# Patient Record
Sex: Female | Born: 1955
Health system: Southern US, Community
[De-identification: ages and names within clinical notes are randomized; demographics above are authoritative.]

## PROBLEM LIST (undated history)

## (undated) DIAGNOSIS — I809 Phlebitis and thrombophlebitis of unspecified site: Secondary | ICD-10-CM

## (undated) HISTORY — DX: Phlebitis and thrombophlebitis of unspecified site: I80.9

## (undated) HISTORY — PX: MOHS SURGERY: SUR867

---

## 1998-06-17 ENCOUNTER — Other Ambulatory Visit: Admission: RE | Admit: 1998-06-17 | Discharge: 1998-06-17 | Payer: Self-pay | Admitting: Obstetrics & Gynecology

## 1999-08-04 ENCOUNTER — Other Ambulatory Visit: Admission: RE | Admit: 1999-08-04 | Discharge: 1999-08-04 | Payer: Self-pay | Admitting: Obstetrics & Gynecology

## 2000-04-09 ENCOUNTER — Ambulatory Visit (HOSPITAL_BASED_OUTPATIENT_CLINIC_OR_DEPARTMENT_OTHER): Admission: RE | Admit: 2000-04-09 | Discharge: 2000-04-09 | Payer: Self-pay | Admitting: Plastic Surgery

## 2000-04-09 ENCOUNTER — Encounter (INDEPENDENT_AMBULATORY_CARE_PROVIDER_SITE_OTHER): Payer: Self-pay | Admitting: Specialist

## 2000-08-09 ENCOUNTER — Other Ambulatory Visit: Admission: RE | Admit: 2000-08-09 | Discharge: 2000-08-09 | Payer: Self-pay | Admitting: Obstetrics & Gynecology

## 2000-09-17 ENCOUNTER — Other Ambulatory Visit: Admission: RE | Admit: 2000-09-17 | Discharge: 2000-09-17 | Payer: Self-pay | Admitting: Obstetrics & Gynecology

## 2000-09-17 ENCOUNTER — Encounter (INDEPENDENT_AMBULATORY_CARE_PROVIDER_SITE_OTHER): Payer: Self-pay

## 2000-12-15 ENCOUNTER — Other Ambulatory Visit: Admission: RE | Admit: 2000-12-15 | Discharge: 2000-12-15 | Payer: Self-pay | Admitting: Obstetrics & Gynecology

## 2001-06-17 ENCOUNTER — Other Ambulatory Visit: Admission: RE | Admit: 2001-06-17 | Discharge: 2001-06-17 | Payer: Self-pay | Admitting: Obstetrics & Gynecology

## 2001-12-19 ENCOUNTER — Other Ambulatory Visit: Admission: RE | Admit: 2001-12-19 | Discharge: 2001-12-19 | Payer: Self-pay | Admitting: Obstetrics & Gynecology

## 2002-07-19 ENCOUNTER — Other Ambulatory Visit: Admission: RE | Admit: 2002-07-19 | Discharge: 2002-07-19 | Payer: Self-pay | Admitting: Obstetrics & Gynecology

## 2003-01-18 ENCOUNTER — Other Ambulatory Visit: Admission: RE | Admit: 2003-01-18 | Discharge: 2003-01-18 | Payer: Self-pay | Admitting: Obstetrics & Gynecology

## 2004-02-27 ENCOUNTER — Other Ambulatory Visit: Admission: RE | Admit: 2004-02-27 | Discharge: 2004-02-27 | Payer: Self-pay | Admitting: Obstetrics & Gynecology

## 2004-03-20 ENCOUNTER — Ambulatory Visit: Admission: RE | Admit: 2004-03-20 | Discharge: 2004-03-20 | Payer: Self-pay | Admitting: Hematology & Oncology

## 2004-09-05 ENCOUNTER — Ambulatory Visit: Payer: Self-pay | Admitting: Family Medicine

## 2005-03-19 ENCOUNTER — Other Ambulatory Visit: Admission: RE | Admit: 2005-03-19 | Discharge: 2005-03-19 | Payer: Self-pay | Admitting: Obstetrics & Gynecology

## 2007-01-18 DIAGNOSIS — I809 Phlebitis and thrombophlebitis of unspecified site: Secondary | ICD-10-CM | POA: Insufficient documentation

## 2007-09-21 LAB — CONVERTED CEMR LAB: Pap Smear: NORMAL

## 2007-11-25 ENCOUNTER — Ambulatory Visit: Payer: Self-pay | Admitting: Family Medicine

## 2007-11-28 ENCOUNTER — Encounter (INDEPENDENT_AMBULATORY_CARE_PROVIDER_SITE_OTHER): Payer: Self-pay | Admitting: *Deleted

## 2007-12-01 LAB — CONVERTED CEMR LAB
ALT: 20 units/L (ref 0–35)
AST: 22 units/L (ref 0–37)
Albumin: 4.1 g/dL (ref 3.5–5.2)
Alkaline Phosphatase: 55 units/L (ref 39–117)
BUN: 13 mg/dL (ref 6–23)
Basophils Absolute: 0 10*3/uL (ref 0.0–0.1)
Basophils Relative: 0.5 % (ref 0.0–1.0)
Bilirubin, Direct: 0.1 mg/dL (ref 0.0–0.3)
CO2: 29 meq/L (ref 19–32)
Calcium: 9.1 mg/dL (ref 8.4–10.5)
Chloride: 104 meq/L (ref 96–112)
Cholesterol: 193 mg/dL (ref 0–200)
Creatinine, Ser: 0.9 mg/dL (ref 0.4–1.2)
Eosinophils Absolute: 0.2 10*3/uL (ref 0.0–0.7)
Eosinophils Relative: 3.9 % (ref 0.0–5.0)
GFR calc Af Amer: 85 mL/min
GFR calc non Af Amer: 70 mL/min
Glucose, Bld: 93 mg/dL (ref 70–99)
HCT: 37.4 % (ref 36.0–46.0)
HDL: 82.5 mg/dL (ref >39.0)
Hemoglobin: 12.9 g/dL (ref 12.0–15.0)
LDL Cholesterol: 103 mg/dL — ABNORMAL HIGH (ref 0–99)
Lymphocytes Relative: 26 % (ref 12.0–46.0)
MCHC: 34.5 g/dL (ref 30.0–36.0)
MCV: 98 fL (ref 78.0–100.0)
Monocytes Absolute: 0.4 10*3/uL (ref 0.1–1.0)
Monocytes Relative: 7.6 % (ref 3.0–12.0)
Neutro Abs: 3.6 10*3/uL (ref 1.4–7.7)
Neutrophils Relative %: 62 % (ref 43.0–77.0)
Platelets: 309 10*3/uL (ref 150–400)
Potassium: 3.7 meq/L (ref 3.5–5.1)
RBC: 3.81 M/uL — ABNORMAL LOW (ref 3.87–5.11)
RDW: 12.3 % (ref 11.5–14.6)
Sodium: 139 meq/L (ref 135–145)
TSH: 2.89 microintl units/mL (ref 0.35–5.50)
Total Bilirubin: 0.8 mg/dL (ref 0.3–1.2)
Total CHOL/HDL Ratio: 2.3
Total Protein: 6.5 g/dL (ref 6.0–8.3)
Triglycerides: 40 mg/dL (ref 0–149)
VLDL: 8 mg/dL (ref 0–40)
WBC: 5.7 10*3/uL (ref 4.5–10.5)

## 2007-12-02 ENCOUNTER — Encounter (INDEPENDENT_AMBULATORY_CARE_PROVIDER_SITE_OTHER): Payer: Self-pay | Admitting: *Deleted

## 2008-02-08 ENCOUNTER — Telehealth: Payer: Self-pay | Admitting: Family Medicine

## 2008-02-22 ENCOUNTER — Ambulatory Visit: Payer: Self-pay | Admitting: Internal Medicine

## 2008-03-07 ENCOUNTER — Ambulatory Visit: Payer: Self-pay | Admitting: Internal Medicine

## 2008-03-07 HISTORY — PX: COLONOSCOPY: SHX174

## 2008-03-07 LAB — HM COLONOSCOPY

## 2008-09-19 LAB — CONVERTED CEMR LAB: Pap Smear: NORMAL

## 2009-09-03 ENCOUNTER — Ambulatory Visit: Payer: Self-pay | Admitting: Family Medicine

## 2009-09-03 DIAGNOSIS — M7500 Adhesive capsulitis of unspecified shoulder: Secondary | ICD-10-CM | POA: Insufficient documentation

## 2009-09-03 LAB — CONVERTED CEMR LAB
Bilirubin Urine: NEGATIVE
Blood in Urine, dipstick: NEGATIVE
Glucose, Urine, Semiquant: NEGATIVE
Ketones, urine, test strip: NEGATIVE
Nitrite: NEGATIVE
Specific Gravity, Urine: 1.025
Urobilinogen, UA: NEGATIVE
WBC Urine, dipstick: NEGATIVE
pH: 5

## 2009-09-04 LAB — CONVERTED CEMR LAB
BUN: 14 mg/dL (ref 6–23)
Basophils Absolute: 0 10*3/uL (ref 0.0–0.1)
Basophils Relative: 0.1 % (ref 0.0–3.0)
CO2: 30 meq/L (ref 19–32)
Calcium: 9.1 mg/dL (ref 8.4–10.5)
Chloride: 106 meq/L (ref 96–112)
Cholesterol: 196 mg/dL (ref 0–200)
Creatinine, Ser: 0.9 mg/dL (ref 0.4–1.2)
Eosinophils Absolute: 0.2 10*3/uL (ref 0.0–0.7)
Eosinophils Relative: 3.1 % (ref 0.0–5.0)
GFR calc non Af Amer: 69.27 mL/min (ref >60)
Glucose, Bld: 77 mg/dL (ref 70–99)
HCT: 35.9 % — ABNORMAL LOW (ref 36.0–46.0)
HDL: 86 mg/dL (ref >39.00)
Hemoglobin: 11.9 g/dL — ABNORMAL LOW (ref 12.0–15.0)
LDL Cholesterol: 99 mg/dL (ref 0–99)
Lymphocytes Relative: 31.3 % (ref 12.0–46.0)
Lymphs Abs: 1.5 10*3/uL (ref 0.7–4.0)
MCHC: 33.3 g/dL (ref 30.0–36.0)
MCV: 97.9 fL (ref 78.0–100.0)
Monocytes Absolute: 0.3 10*3/uL (ref 0.1–1.0)
Monocytes Relative: 6.6 % (ref 3.0–12.0)
Neutro Abs: 2.9 10*3/uL (ref 1.4–7.7)
Neutrophils Relative %: 58.9 % (ref 43.0–77.0)
Platelets: 274 10*3/uL (ref 150.0–400.0)
Potassium: 4.4 meq/L (ref 3.5–5.1)
RBC: 3.67 M/uL — ABNORMAL LOW (ref 3.87–5.11)
RDW: 12.6 % (ref 11.5–14.6)
Sodium: 142 meq/L (ref 135–145)
TSH: 2.71 microintl units/mL (ref 0.35–5.50)
Total CHOL/HDL Ratio: 2
Triglycerides: 53 mg/dL (ref 0.0–149.0)
VLDL: 10.6 mg/dL (ref 0.0–40.0)
WBC: 4.9 10*3/uL (ref 4.5–10.5)

## 2010-07-08 NOTE — Assessment & Plan Note (Signed)
Summary: CPX,NO PAP,BCBS INS/RH......   Vital Signs:  Patient profile:   55 year old female Height:      66 inches Weight:      151 pounds BMI:     24.46 Pulse rate:   72 / minute Pulse rhythm:   regular BP sitting:   120 / 78  (left arm) Cuff size:   regular  Vitals Entered By: Army Fossa CMA (September 03, 2009 9:12 AM) CC: CPX, no pap. Pt is fasting   History of Present Illness: Pt here for cpe and labs.  No pap-- pt sees Dr Ishmael Holter.  Preventive Screening-Counseling & Management  Alcohol-Tobacco     Alcohol drinks/day: 1     Alcohol type: wine     Smoking Status: never     Passive Smoke Exposure: no  Caffeine-Diet-Exercise     Caffeine use/day: 3-4     Does Patient Exercise: yes     Type of exercise: water aerobics, treadmill, elliptical     Exercise (avg: min/session): 30-60     Times/week: 5     Exercise Counseling: not indicated; exercise is adequate  Hep-HIV-STD-Contraception     HIV Risk: no     Dental Visit-last 6 months yes     Dental Care Counseling: not indicated; dental care within six months     Sun Exposure-Excessive: rarely  Safety-Violence-Falls     Seat Belt Use: 100      Sexual History:  currently monogamous.    Current Medications (verified): 1)  Aspirin 81 Mg  Tbec (Aspirin) .... Take One Tablet Daily 2)  Womens Multivitamin Plus   Tabs (Multiple Vitamins-Minerals) .Marland Kitchen.. 1 By Mouth Once Daily 3)  Calcium 500/d 500-200 Mg-Unit  Tabs (Calcium Carbonate-Vitamin D) .... 3aday 4)  D 1000 Plus   Tabs (Fa-Cyanocobalamin-B6-D-Ca) .Marland Kitchen.. 1 By Mouth Once Daily 5)  B-100 Complex   Tabs (Vitamins-Lipotropics) .Marland Kitchen.. 1 By Mouth Qd  Allergies (verified): No Known Drug Allergies  Past History:  Past Medical History: Last updated: 11/25/2007 phlebitis after c section--l leg BCC--  Amy [place]  Past Surgical History: Last updated: 11/25/2007 Caesarean section X2  Family History: Last updated: 11/25/2007 Family History Breast cancer 1st degree  relative <50 Family History Diabetes 1st degree relative M-  Alz dementia Family History of CAD Female 1st degree relative <60  Social History: Last updated: 11/25/2007 Married with 2 sons  Never Smoked Alcohol use-yes Drug use-no Regular exercise-yes  Risk Factors: Alcohol Use: 1 (09/03/2009) Caffeine Use: 3-4 (09/03/2009) Exercise: yes (09/03/2009)  Risk Factors: Smoking Status: never (09/03/2009) Passive Smoke Exposure: no (09/03/2009)  Family History: Reviewed history from 11/25/2007 and no changes required. Family History Breast cancer 1st degree relative <50 Family History Diabetes 1st degree relative M-  Alz dementia Family History of CAD Female 1st degree relative <60  Social History: Reviewed history from 11/25/2007 and no changes required. Married with 2 sons  Never Smoked Alcohol use-yes Drug use-no Regular exercise-yes Caffeine use/day:  3-4 Dental Care w/in 6 mos.:  yes Sexual History:  currently monogamous  Review of Systems      See HPI General:  Denies chills, fatigue, fever, loss of appetite, malaise, sleep disorder, sweats, weakness, and weight loss. Eyes:  Denies blurring, discharge, double vision, eye irritation, eye pain, halos, itching, light sensitivity, red eye, vision loss-1 eye, and vision loss-both eyes; optho--q2y. ENT:  Denies decreased hearing, difficulty swallowing, ear discharge, earache, hoarseness, nasal congestion, nosebleeds, postnasal drainage, ringing in ears, sinus pressure, and sore throat. CV:  Denies bluish discoloration of lips or nails, chest pain or discomfort, difficulty breathing at night, difficulty breathing while lying down, fainting, fatigue, leg cramps with exertion, lightheadness, near fainting, palpitations, shortness of breath with exertion, swelling of feet, swelling of hands, and weight gain. Resp:  Denies chest discomfort, chest pain with inspiration, cough, coughing up blood, excessive snoring, hypersomnolence,  morning headaches, pleuritic, shortness of breath, sputum productive, and wheezing. GI:  Denies abdominal pain, bloody stools, change in bowel habits, constipation, dark tarry stools, diarrhea, excessive appetite, gas, hemorrhoids, indigestion, loss of appetite, nausea, vomiting, vomiting blood, and yellowish skin color. GU:  Denies abnormal vaginal bleeding, decreased libido, discharge, dysuria, genital sores, hematuria, incontinence, nocturia, urinary frequency, and urinary hesitancy. MS:  Denies joint pain, joint redness, joint swelling, loss of strength, low back pain, mid back pain, muscle aches, muscle , cramps, muscle weakness, stiffness, and thoracic pain. Derm:  Denies changes in color of skin, changes in nail beds, dryness, excessive perspiration, flushing, hair loss, insect bite(s), itching, lesion(s), poor wound healing, and rash; optho-q2y. Neuro:  Denies brief paralysis, difficulty with concentration, disturbances in coordination, falling down, headaches, inability to speak, memory loss, numbness, poor balance, seizures, sensation of room spinning, tingling, tremors, visual disturbances, and weakness. Psych:  Denies alternate hallucination ( auditory/visual), anxiety, depression, easily angered, easily tearful, irritability, mental problems, panic attacks, sense of great danger, suicidal thoughts/plans, thoughts of violence, unusual visions or sounds, and thoughts /plans of harming others. Endo:  Denies cold intolerance, excessive hunger, excessive thirst, excessive urination, heat intolerance, polyuria, and weight change. Heme:  Denies abnormal bruising, bleeding, enlarge lymph nodes, fevers, pallor, and skin discoloration.  Physical Exam  General:  Well-developed,well-nourished,in no acute distress; alert,appropriate and cooperative throughout examination Head:  Normocephalic and atraumatic without obvious abnormalities. No apparent alopecia or balding. Eyes:  No corneal or conjunctival  inflammation noted. EOMI. Perrla. Funduscopic exam benign, without hemorrhages, exudates or papilledema. Vision grossly normal. Ears:  External ear exam shows no significant lesions or deformities.  Otoscopic examination reveals clear canals, tympanic membranes are intact bilaterally without bulging, retraction, inflammation or discharge. Hearing is grossly normal bilaterally. Nose:  External nasal examination shows no deformity or inflammation. Nasal mucosa are pink and moist without lesions or exudates. Mouth:  Oral mucosa and oropharynx without lesions or exudates.  Teeth in good repair. Neck:  No deformities, masses, or tenderness noted.no carotid bruits.   Chest Wall:  No deformities, masses, or tenderness noted. Breasts:  No mass, nodules, thickening, tenderness, bulging, retraction, inflamation, nipple discharge or skin changes noted.   Lungs:  Normal respiratory effort, chest expands symmetrically. Lungs are clear to auscultation, no crackles or wheezes. Heart:  normal rate and no murmur.   Abdomen:  Bowel sounds positive,abdomen soft and non-tender without masses, organomegaly or hernias noted. Rectal:  No external abnormalities noted. Normal sphincter tone. No rectal masses or tenderness. Msk:  normal ROM, no joint tenderness, no joint swelling, no joint warmth, no redness over joints, no joint deformities, no joint instability, and no crepitation.   Pulses:  R posterior tibial normal, R dorsalis pedis normal, R carotid normal, L posterior tibial normal, L dorsalis pedis normal, and L carotid normal.   Extremities:  No clubbing, cyanosis, edema, or deformity noted with normal full range of motion of all joints.   Neurologic:  No cranial nerve deficits noted. Station and gait are normal. Plantar reflexes are down-going bilaterally. DTRs are symmetrical throughout. Sensory, motor and coordinative functions appear intact. Skin:  Intact without suspicious  lesions or rashes Cervical Nodes:  No  lymphadenopathy noted Psych:  Cognition and judgment appear intact. Alert and cooperative with normal attention span and concentration. No apparent delusions, illusions, hallucinations   Impression & Recommendations:  Problem # 1:  PREVENTIVE HEALTH CARE (ICD-V70.0) check labs ghm utd Orders: Venipuncture (16109) TLB-Lipid Panel (80061-LIPID) TLB-BMP (Basic Metabolic Panel-BMET) (80048-METABOL) TLB-CBC Platelet - w/Differential (85025-CBCD) TLB-Hepatic/Liver Function Pnl (80076-HEPATIC) TLB-TSH (Thyroid Stimulating Hormone) (84443-TSH) UA Dipstick w/o Micro (manual) (60454) EKG w/ Interpretation (93000)  Complete Medication List: 1)  Aspirin 81 Mg Tbec (Aspirin) .... Take one tablet daily 2)  Womens Multivitamin Plus Tabs (Multiple vitamins-minerals) .Marland Kitchen.. 1 by mouth once daily 3)  Calcium 500/d 500-200 Mg-unit Tabs (Calcium carbonate-vitamin d) .... 3aday 4)  D 1000 Plus Tabs (Fa-cyanocobalamin-b6-d-ca) .Marland Kitchen.. 1 by mouth once daily 5)  B-100 Complex Tabs (Vitamins-lipotropics) .Marland Kitchen.. 1 by mouth qd   EKG  Procedure date:  09/03/2009  Findings:      Normal sinus rhythm with rate of:  65   Last PAP:  Normal (09/21/2007 9:41:37 AM) PAP Result Date:  09/19/2008 PAP Result:  normal PAP Next Due:  1 yr Last Mammogram:  Normal Bilateral (09/21/2007 9:41:07 AM) Mammogram Result Date:  09/19/2008 Mammogram Result:  normal Mammogram Next Due:  1 yr  Laboratory Results   Urine Tests    Routine Urinalysis   Color: yellow Appearance: Clear Glucose: negative   (Normal Range: Negative) Bilirubin: negative   (Normal Range: Negative) Ketone: negative   (Normal Range: Negative) Spec. Gravity: 1.025   (Normal Range: 1.003-1.035) Blood: negative   (Normal Range: Negative) pH: 5.0   (Normal Range: 5.0-8.0) Protein: trace   (Normal Range: Negative) Urobilinogen: negative   (Normal Range: 0-1) Nitrite: negative   (Normal Range: Negative) Leukocyte Esterace: negative   (Normal Range:  Negative)    Comments: Floydene Flock  September 03, 2009 1:01 PM

## 2010-07-08 NOTE — Letter (Signed)
Summary: Results Follow-up Letter  [place] [facility]  [address] [place], [place] [address]   Phone: [phone]  Fax: [phone]    12/02/2007        Anita Galloway [address] [place], [place]  [address]  Dear Ms. Laroche,   The following are the results of your recent test(s):  Test     Result     Pap Smear    Normal_______  Not Normal_____       Comments: _________________________________________________________ Cholesterol LDL(Bad cholesterol):          Your goal is less than:         HDL (Good cholesterol):        Your goal is more than: _________________________________________________________ Other Tests:   _________________________________________________________  Please call for an appointment Or __Please see attached lab report.______________________________________________________ _________________________________________________________ _________________________________________________________  Sincerely,  Ardyth Man [place] at [facility]

## 2010-07-08 NOTE — Letter (Signed)
Summary: Primary Care Consult Scheduled Letter  [place] [facility] [place]  [address] [place], [place] [address]   Phone: [phone]  Fax: [phone]      11/28/2007 MRN: [id number]  Anita Galloway [address] [place], [place]  [address]    Dear Ms. Orlowski,      We have scheduled an appointment for you.  At the recommendation of Dr.Lowne, we have scheduled you a consult with Dr. Juanda Chance on July 16th at 11:00 check in at 10:00am.  Their address is [facility] [address] The office phone number is [phone].  If this appointment day and time is not convenient for you, please feel free to call the office of the doctor you are being referred to at the number listed above and reschedule the appointment.     It is important for you to keep your scheduled appointments. We are here to make sure you are given good patient care. If you have questions or you have made changes to your appointment, please notify us at  [phone], ask for Tiffany.    Thank you,  Patient Care Coordinator [place] at [facility]

## 2010-07-08 NOTE — Procedures (Signed)
Summary: Colonoscopy   Colonoscopy  Procedure date:  03/07/2008  Findings:      Location:  [facility].    Procedures Next Due Date:    Colonoscopy: 03/2018  Patient Name: Anita Galloway, Anita Galloway. MRN:  Procedure Procedures: Colonoscopy CPT: [phone].  Personnel: Endoscopist: Dora L. Juanda Chance, MD.  Exam Location: Exam performed in Outpatient Clinic. Outpatient  Patient Consent: Procedure, Alternatives, Risks and Benefits discussed, consent obtained, from patient. Consent was obtained by the RN.  Indications  Average Risk Screening Routine.  History  Current Medications: Patient is not currently taking Coumadin.  Pre-Exam Physical: Performed Mar 07, 2008. Entire physical exam was normal.  Comments: Pt. history reviewed/updated, physical exam performed prior to initiation of sedation?yes Exam Exam: Extent of exam reached: Cecum, extent intended: Cecum.  The cecum was identified by appendiceal orifice and IC valve. Duration of exam: time in 10:10  min, out 5:25 min minutes. ASA Classification: I. Tolerance: good.  Monitoring: Pulse and BP monitoring, Oximetry used. Supplemental O2 given.  Colon Prep Used Miralax for colon prep. Prep results: good.  Sedation Meds: Patient assessed and found to be appropriate for moderate (conscious) sedation. Fentanyl 100 mcg. given IV. Versed 8 mg. given IV.  Findings - NORMAL EXAM: Cecum.  - DIVERTICULOSIS: Sigmoid Colon. ICD9: Diverticulosis: 562.10. Comments: mild sigmoid diverticulosis.  - NORMAL EXAM: Rectum.   Assessment Normal examination.  Diagnoses: 562.10: Diverticulosis.   Comments: minimal sigmoid  diverticulosis Events  Unplanned Interventions: No intervention was required.  Unplanned Events: There were no complications. Plans Patient Education: Patient given standard instructions for: Patient instructed to get routine colonoscopy every 10 years.  Disposition: After procedure patient sent to  recovery. After recovery patient sent home.    cc: Freddy Finner, MD     Loreen Freud, MD  This report was created from the original endoscopy report, which was reviewed and signed by the above listed endoscopist.

## 2010-07-08 NOTE — Miscellaneous (Signed)
Summary: GI PV Prep  Clinical Lists Changes  Medications: Added new medication of MIRALAX   POWD (POLYETHYLENE GLYCOL 3350) per prep instructions - Signed Added new medication of DULCOLAX 5 MG  TBEC (BISACODYL) per prep instructions - Signed Added new medication of REGLAN 10 MG  TABS (METOCLOPRAMIDE HCL) per prep instructions - Signed Rx of MIRALAX   POWD (POLYETHYLENE GLYCOL 3350) per prep instructions;  #1 x 0;  Signed;  Entered by: Ezra Sites RN;  Authorized by: Hart Carwin MD;  Method used: [facility] [address] [phone]*, [address] [place], [place], [place]  [address], Ph: [phone] or [phone], Fax: [phone] Rx of DULCOLAX 5 MG  TBEC (BISACODYL) per prep instructions;  #4 x 0;  Signed;  Entered by: Ezra Sites RN;  Authorized by: Hart Carwin MD;  Method used: [facility] [address] [phone]*, [address] [place], [place], [place]  [address], Ph: [phone] or [phone], Fax: [phone] Rx of REGLAN 10 MG  TABS (METOCLOPRAMIDE HCL) per prep instructions;  #2 x 0;  Signed;  Entered by: Ezra Sites RN;  Authorized by: Hart Carwin MD;  Method used: [facility] [address] [phone]*, [address], [place], [place], [place]  [address], Ph: [phone] or [phone], Fax: [phone] Observations: Added new observation of NKA: T (02/22/2008 13:28)    Prescriptions: REGLAN 10 MG  TABS (METOCLOPRAMIDE HCL) per prep instructions  #2 x 0   Entered by:   Ezra Sites RN   Authorized by:   Hart Carwin MD   Signed by:   Ezra Sites RN on 02/22/2008   Method used:   [facility] to        [facility] [address] [phone]* (retail)       [address]       [place], [place]  [address]       Ph: [phone] or [phone]       Fax: [phone]   RxID:   [id number] DULCOLAX 5 MG  TBEC (BISACODYL) per prep instructions  #4 x 0   Entered by:   Ezra Sites RN   Authorized by:   Hart Carwin MD   Signed by:   Ezra Sites RN on 02/22/2008   Method used:   [facility]ron[facility] to        [facility] [address] [phone]* (retail)       [address]       [place], [place]  [address]       Ph: [phone] or [phone]       Fax: [phone]   RxID:   [id number] MIRALAX   POWD (POLYETHYLENE GLYCOL 3350) per prep instructions  #1 x 0   Entered by:   Ezra Sites RN   Authorized by:   Hart Carwin MD   Signed by:   Ezra Sites RN on 02/22/2008   Method used:   [facility] to        [facility] [address] [phone]* (retail)       [address] [place] [address]       [place], [place]  [address]       Ph: [phone] or [phone]       Fax: [phone]   RxID:   [id number]

## 2010-07-08 NOTE — Assessment & Plan Note (Signed)
Summary: CPX//VGJ   Vital Signs:  Patient Profile:   55 Years Old Female Weight:      148.13 pounds Temp:     98.4 degrees F oral Pulse rate:   68 / minute Resp:     14 per minute BP sitting:   120 / 74  (right arm)  Pt. in pain?   no  Vitals Entered By: Ardyth Man (November 25, 2007 9:16 AM)                  PCP:  Laury Axon  Chief Complaint:  CPX-Fasting Labs.  History of Present Illness: Pt here for CPE and labs.  Pt has Gyn.  No complaints.     Current Allergies: No known allergies   Past Medical History:    phlebitis after c section--l leg    BCC--  [facility] [place]  Past Surgical History:    Caesarean section X2   Family History:    Family History Breast cancer 1st degree relative <50    Family History Diabetes 1st degree relative    M-  Alz dementia    Family History of CAD Female 1st degree relative <60  Social History:    Married with 2 sons        Never Smoked    Alcohol use-yes    Drug use-no    Regular exercise-yes   Risk Factors:  Tobacco use:  never Passive smoke exposure:  no Drug use:  no HIV high-risk behavior:  no Caffeine use:  1 drinks per day Alcohol use:  yes    Type:  wine    Drinks per day:  <1    Has patient --       Felt need to cut down:  no       Been annoyed by complaints:  no       Felt guilty about drinking:  no       Needed eye opener in the morning:  no    Counseled to quit/cut down alcohol use:  no Exercise:  yes    Times per week:  5    Type:  water aerobics, treadmill, elliptical Seatbelt use:  100 % Sun Exposure:  rarely  Family History Risk Factors:    Family History of MI in females < 87 years old:  no    Family History of MI in males < 79 years old:  no  PAP Smear History:     Date of Last PAP Smear:  09/21/2007    Results:  Normal   Mammogram History:     Date of Last Mammogram:  09/21/2007    Results:  Normal Bilateral    Review of Systems      See HPI  General      Denies chills, fatigue,  fever, loss of appetite, malaise, sleep disorder, sweats, weakness, and weight loss.  Eyes      Denies blurring, discharge, double vision, eye irritation, eye pain, halos, itching, light sensitivity, red eye, vision loss-1 eye, and vision loss-both eyes.      optho--q2 y----[facility]  ENT      Denies decreased hearing, difficulty swallowing, ear discharge, earache, hoarseness, nasal congestion, nosebleeds, postnasal drainage, ringing in ears, sinus pressure, and sore throat.      dentist= q3m----[facility]  CV      Denies bluish discoloration of lips or nails, chest pain or discomfort, difficulty breathing at night, difficulty breathing while lying down, fainting, fatigue, leg cramps with exertion, lightheadness,  near fainting, palpitations, shortness of breath with exertion, swelling of feet, swelling of hands, and weight gain.  Resp      Denies chest discomfort, chest pain with inspiration, cough, coughing up blood, excessive snoring, hypersomnolence, morning headaches, pleuritic, shortness of breath, sputum productive, and wheezing.  GI      Denies abdominal pain, bloody stools, change in bowel habits, constipation, dark tarry stools, diarrhea, excessive appetite, gas, hemorrhoids, indigestion, loss of appetite, nausea, vomiting, vomiting blood, and yellowish skin color.  GU      Denies abnormal vaginal bleeding, decreased libido, discharge, dysuria, genital sores, hematuria, incontinence, nocturia, urinary frequency, and urinary hesitancy.  MS      Complains of joint pain.      Denies joint redness, joint swelling, loss of strength, low back pain, mid back pain, muscle aches, muscle , cramps, muscle weakness, stiffness, and thoracic pain.  Derm      Denies changes in color of skin, changes in nail beds, dryness, excessive perspiration, flushing, hair loss, insect bite(s), itching, lesion(s), poor wound healing, and rash.      Derm-- annually----j[facility]  Neuro      Denies brief  paralysis, difficulty with concentration, disturbances in coordination, falling down, headaches, inability to speak, memory loss, numbness, poor balance, seizures, sensation of room spinning, tingling, tremors, visual disturbances, and weakness.  Psych      Denies alternate hallucination ( auditory/visual), anxiety, depression, easily angered, easily tearful, irritability, mental problems, panic attacks, sense of great danger, suicidal thoughts/plans, thoughts of violence, unusual visions or sounds, and thoughts /plans of harming others.  Endo      Denies cold intolerance, excessive hunger, excessive thirst, excessive urination, heat intolerance, polyuria, and weight change.  Heme      Denies abnormal bruising, bleeding, enlarge lymph nodes, fevers, pallor, and skin discoloration.  Allergy      Denies hives or rash, itching eyes, persistent infections, seasonal allergies, and sneezing.   Physical Exam  General:     Well-developed,well-nourished,in no acute distress; alert,appropriate and cooperative throughout examination Head:     Normocephalic and atraumatic without obvious abnormalities. No apparent alopecia or balding. Eyes:     vision grossly intact, pupils equal, pupils round, and pupils reactive to light.   Ears:     External ear exam shows no significant lesions or deformities.  Otoscopic examination reveals clear canals, tympanic membranes are intact bilaterally without bulging, retraction, inflammation or discharge. Hearing is grossly normal bilaterally. Nose:     External nasal examination shows no deformity or inflammation. Nasal mucosa are pink and moist without lesions or exudates. Mouth:     Oral mucosa and oropharynx without lesions or exudates.  Teeth in good repair. Neck:     No deformities, masses, or tenderness noted.no carotid bruits and no cervical lymphadenopathy.   Chest Wall:     No deformities, masses, or tenderness noted. Breasts:     gyn Lungs:      Normal respiratory effort, chest expands symmetrically. Lungs are clear to auscultation, no crackles or wheezes. Heart:     normal rate, regular rhythm, and no murmur.   Abdomen:     Bowel sounds positive,abdomen soft and non-tender without masses, organomegaly or hernias noted. Rectal:     gyn Genitalia:     gyn Msk:     normal ROM, no joint tenderness, no joint swelling, no joint warmth, no redness over joints, no joint deformities, no joint instability, and no crepitation.   Pulses:     R  dorsalis pedis normal, R carotid normal, L posterior tibial normal, L dorsalis pedis normal, and L carotid normal.   Extremities:     No clubbing, cyanosis, edema, or deformity noted with normal full range of motion of all joints.   Neurologic:     alert & oriented X3, cranial nerves II-XII intact, strength normal in all extremities, gait normal, and DTRs symmetrical and normal.   Skin:     Intact without suspicious lesions or rashes Cervical Nodes:     No lymphadenopathy noted Psych:     Cognition and judgment appear intact. Alert and cooperative with normal attention span and concentration. No apparent delusions, illusions, hallucinations    Impression & Recommendations:  Problem # 1:  PREVENTIVE HEALTH CARE (ICD-V70.0) IMMUN UTD GHM utd mammo and pap per gyn ca supplementation  Orders: Gastroenterology Referral (GI) colon Venipuncture (54008) TLB-Lipid Panel (80061-LIPID) TLB-BMP (Basic Metabolic Panel-BMET) (80048-METABOL) TLB-CBC Platelet - w/Differential (85025-CBCD) TLB-Hepatic/Liver Function Pnl (80076-HEPATIC) TLB-TSH (Thyroid Stimulating Hormone) (84443-TSH)  Orders: Gastroenterology Referral (GI) Venipuncture (67619) TLB-Lipid Panel (80061-LIPID) TLB-BMP (Basic Metabolic Panel-BMET) (80048-METABOL) TLB-CBC Platelet - w/Differential (85025-CBCD) TLB-Hepatic/Liver Function Pnl (80076-HEPATIC) TLB-TSH (Thyroid Stimulating Hormone) (84443-TSH)   Complete Medication  List: 1)  Aspirin 81 Mg Tbec (Aspirin) .... Take one tablet daily 2)  Womens Multivitamin Plus Tabs (Multiple vitamins-minerals) .Marland Kitchen.. 1 by mouth once daily 3)  Calcium 500/d 500-200 Mg-unit Tabs (Calcium carbonate-vitamin d) .... 3aday 4)  D 1000 Plus Tabs (Fa-cyanocobalamin-b6-d-ca) .Marland Kitchen.. 1 by mouth once daily 5)  B-100 Complex Tabs (Vitamins-lipotropics) .Marland Kitchen.. 1 by mouth qd    ]  EKG  Procedure date:  11/25/2007  Findings:      sinus rhythm  70 bpm     EKG  Procedure date:  11/25/2007  Findings:      sinus rhythm  70 bpm     Appended Document: CPX//VGJ  Laboratory Results   Urine Tests   Date/Time Reported: November 25, 2007 10:48 AM   Routine Urinalysis   Color: yellow Appearance: Clear Glucose: negative   (Normal Range: Negative) Bilirubin: negative   (Normal Range: Negative) Ketone: negative   (Normal Range: Negative) Spec. Gravity: <1.005   (Normal Range: 1.003-1.035) Blood: negative   (Normal Range: Negative) pH: 6.5   (Normal Range: 5.0-8.0) Protein: negative   (Normal Range: Negative) Urobilinogen: 0.2   (Normal Range: 0-1) Nitrite: negative   (Normal Range: Negative) Leukocyte Esterace: negative   (Normal Range: Negative)    Comments: Shonna Chock  November 25, 2007 10:49 AM

## 2010-07-08 NOTE — Progress Notes (Signed)
Summary: Anita Galloway  Phone Note Call from Patient   Summary of Call: Missed appt. for colonoscopy with Dr. Juanda Chance and is calling their office now to rescheduled. Ardyth Man  February 08, 2008 1:36 PM

## 2010-10-24 NOTE — Op Note (Signed)
[place]  Patient:    CHAYIL, GANTT                      MRN: [id number] Proc. Date: 04/09/00 Adm. Date:  [id number] Disc. Date: [id number] Attending:  Loura Halt Ii CC:         Rolin Barry. Jennye Moccasin, M.D.   Operative Report  PREOPERATIVE DIAGNOSIS:  An 8 mm basal cell carcinoma left paranasal area.  POSTOPERATIVE DIAGNOSIS:  An 8 mm basal cell carcinoma left paranasal area.  OPERATION:  Excision basal cell carcinoma left paranasal area.  SURGEON:  Alfredia Ferguson, M.D.  ANESTHESIA:  2% xylocaine with 1:100,000 epinephrine  INDICATION FOR PROCEDURE:  This is a 55 year old woman with approximately 8 mm basal cell carcinoma which is biopsy proven in the left paranasal area.  She wishes to have it excised.  The patient was counselled regarding the potential risks of positive margins which would necessitate secondary surgery.  She also understands the risks of unsightly scarring. Inspite of that, she wishes to proceed with the surgery.  DESCRIPTION OF PROCEDURE:  Skin markers were placed in an elliptical fashion around the lesion with approximately 2 mm margins of normal skin.  Local anesthesia was then infiltrated and the left side of the face was prepped and draped in a sterile fashion.  After awaiting approximately 7 minutes an elliptical excision of the lesion down to the level of the subcutaneous tissue was carried out and the lesion was passed off for pathology.  Wound edges were undermined for a distance of 4 to 5 mm in all direction.  Hemostasis was accomplished using cautery.  The wound was closed by approximating the dermis using interrupted 4-0 Vicryl suture.  The skin was united using a running 6-0 nylon suture.  The area was cleansed and a light dressing was applied.  The patient was discharged to home in satisfactory condition. DD:  04/13/00 TD:  04/14/00 Job: [id number]

## 2011-02-25 ENCOUNTER — Other Ambulatory Visit: Payer: Self-pay | Admitting: Obstetrics & Gynecology

## 2011-02-25 DIAGNOSIS — R928 Other abnormal and inconclusive findings on diagnostic imaging of breast: Secondary | ICD-10-CM

## 2011-03-09 LAB — HM PAP SMEAR

## 2011-03-25 ENCOUNTER — Ambulatory Visit
Admission: RE | Admit: 2011-03-25 | Discharge: 2011-03-25 | Disposition: A | Discharge status: Home / Self Care | Payer: BC Managed Care – PPO | Source: Ambulatory Visit | Attending: Obstetrics & Gynecology | Admitting: Obstetrics & Gynecology

## 2011-03-25 ENCOUNTER — Other Ambulatory Visit: Payer: Self-pay | Admitting: Obstetrics & Gynecology

## 2011-03-25 DIAGNOSIS — R928 Other abnormal and inconclusive findings on diagnostic imaging of breast: Secondary | ICD-10-CM

## 2011-08-17 ENCOUNTER — Other Ambulatory Visit: Payer: Self-pay | Admitting: Obstetrics & Gynecology

## 2011-08-17 DIAGNOSIS — N6489 Other specified disorders of breast: Secondary | ICD-10-CM

## 2011-08-24 ENCOUNTER — Ambulatory Visit
Admission: RE | Admit: 2011-08-24 | Discharge: 2011-08-24 | Disposition: A | Discharge status: Home / Self Care | Payer: BC Managed Care – PPO | Source: Ambulatory Visit | Attending: Obstetrics & Gynecology | Admitting: Obstetrics & Gynecology

## 2011-08-24 DIAGNOSIS — N6489 Other specified disorders of breast: Secondary | ICD-10-CM

## 2011-10-08 ENCOUNTER — Ambulatory Visit (INDEPENDENT_AMBULATORY_CARE_PROVIDER_SITE_OTHER): Payer: BC Managed Care – PPO | Admitting: Family Medicine

## 2011-10-08 ENCOUNTER — Encounter: Payer: Self-pay | Admitting: Family Medicine

## 2011-10-08 VITALS — BP 114/76 | HR 77 | Temp 98.2°F | Ht 66.25 in | Wt 149.6 lb

## 2011-10-08 DIAGNOSIS — D649 Anemia, unspecified: Secondary | ICD-10-CM

## 2011-10-08 DIAGNOSIS — Z0000 Encounter for general adult medical examination without abnormal findings: Secondary | ICD-10-CM

## 2011-10-08 LAB — POCT URINALYSIS DIPSTICK
Bilirubin, UA: NEGATIVE
Blood, UA: NEGATIVE
Glucose, UA: NEGATIVE
Ketones, UA: NEGATIVE
Leukocytes, UA: NEGATIVE
Nitrite, UA: NEGATIVE
Protein, UA: NEGATIVE
Spec Grav, UA: >=1.03
Urobilinogen, UA: 0.2
pH, UA: 6

## 2011-10-08 LAB — CBC WITH DIFFERENTIAL/PLATELET
Basophils Absolute: 0 10*3/uL (ref 0.0–0.1)
Basophils Relative: 0.3 % (ref 0.0–3.0)
Eosinophils Absolute: 0.2 10*3/uL (ref 0.0–0.7)
Eosinophils Relative: 3.5 % (ref 0.0–5.0)
HCT: 36.7 % (ref 36.0–46.0)
Hemoglobin: 12.4 g/dL (ref 12.0–15.0)
Lymphocytes Relative: 26.6 % (ref 12.0–46.0)
Lymphs Abs: 1.6 10*3/uL (ref 0.7–4.0)
MCHC: 33.8 g/dL (ref 30.0–36.0)
MCV: 94.9 fl (ref 78.0–100.0)
Monocytes Absolute: 0.3 10*3/uL (ref 0.1–1.0)
Monocytes Relative: 5.2 % (ref 3.0–12.0)
Neutro Abs: 3.9 10*3/uL (ref 1.4–7.7)
Neutrophils Relative %: 64.4 % (ref 43.0–77.0)
Platelets: 287 10*3/uL (ref 150.0–400.0)
RBC: 3.86 Mil/uL — ABNORMAL LOW (ref 3.87–5.11)
RDW: 13.7 % (ref 11.5–14.6)
WBC: 6 10*3/uL (ref 4.5–10.5)

## 2011-10-08 LAB — BASIC METABOLIC PANEL
BUN: 16 mg/dL (ref 6–23)
CO2: 27 mEq/L (ref 19–32)
Calcium: 9.1 mg/dL (ref 8.4–10.5)
Chloride: 105 mEq/L (ref 96–112)
Creatinine, Ser: 0.8 mg/dL (ref 0.4–1.2)
GFR: 82.3 mL/min (ref >60.00)
Glucose, Bld: 71 mg/dL (ref 70–99)
Potassium: 3.7 mEq/L (ref 3.5–5.1)
Sodium: 141 mEq/L (ref 135–145)

## 2011-10-08 LAB — HEPATIC FUNCTION PANEL
ALT: 18 U/L (ref 0–35)
AST: 24 U/L (ref 0–37)
Albumin: 4.2 g/dL (ref 3.5–5.2)
Alkaline Phosphatase: 66 U/L (ref 39–117)
Bilirubin, Direct: 0.1 mg/dL (ref 0.0–0.3)
Total Bilirubin: 0.6 mg/dL (ref 0.3–1.2)
Total Protein: 6.5 g/dL (ref 6.0–8.3)

## 2011-10-08 LAB — LIPID PANEL
Cholesterol: 200 mg/dL (ref 0–200)
HDL: 92 mg/dL (ref >39.00)
LDL Cholesterol: 100 mg/dL — ABNORMAL HIGH (ref 0–99)
Total CHOL/HDL Ratio: 2
Triglycerides: 39 mg/dL (ref 0.0–149.0)
VLDL: 7.8 mg/dL (ref 0.0–40.0)

## 2011-10-08 LAB — FERRITIN: Ferritin: 141.3 ng/mL (ref 10.0–291.0)

## 2011-10-08 LAB — IBC PANEL
Iron: 73 ug/dL (ref 42–145)
Saturation Ratios: 21.6 % (ref 20.0–50.0)
Transferrin: 240.9 mg/dL (ref 212.0–360.0)

## 2011-10-08 LAB — TSH: TSH: 3.06 u[IU]/mL (ref 0.35–5.50)

## 2011-10-08 NOTE — Progress Notes (Signed)
  Subjective:     Anita Galloway is a 56 y.o. female and is here for a comprehensive physical exam. The patient reports no problems.  History   Social History  . Marital Status: Married    Spouse Name: N/A    Number of Children: N/A  . Years of Education: N/A   Occupational History  . Not on file.   Social History Main Topics  . Smoking status: Never Smoker   . Smokeless tobacco: Never Used  . Alcohol Use: Yes  . Drug Use: No  . Sexually Active: Not on file   Other Topics Concern  . Not on file   Social History Narrative  . No narrative on file   Health Maintenance  Topic Date Due  . Influenza Vaccine  03/08/2012  . Mammogram  08/23/2013  . Pap Smear  03/08/2014  . Tetanus/tdap  09/06/2014  . Colonoscopy  03/07/2018    The following portions of the patient's history were reviewed and updated as appropriate: allergies, current medications, past family history, past medical history, past social history, past surgical history and problem list.  Review of Systems Review of Systems  Constitutional: Negative for activity change, appetite change and fatigue.  HENT: Negative for hearing loss, congestion, tinnitus and ear discharge.  dentist q31m Eyes: Negative for visual disturbance (see optho q1y -- vision corrected to 20/20 with glasses).  Respiratory: Negative for cough, chest tightness and shortness of breath.   Cardiovascular: Negative for chest pain, palpitations and leg swelling.  Gastrointestinal: Negative for abdominal pain, diarrhea, constipation and abdominal distention.  Genitourinary: Negative for urgency, frequency, decreased urine volume and difficulty urinating.  Musculoskeletal: Negative for back pain, arthralgias and gait problem.  Skin: Negative for color change, pallor and rash.  Neurological: Negative for dizziness, light-headedness, numbness and headaches.  Hematological: Negative for adenopathy. Does not bruise/bleed easily.  Psychiatric/Behavioral:  Negative for suicidal ideas, confusion, sleep disturbance, self-injury, dysphoric mood, decreased concentration and agitation.       Objective:    BP 114/76  Pulse 77  Temp(Src) 98.2 F (36.8 C) (Oral)  Ht 5' 6.25" (1.683 m)  Wt 149 lb 9.6 oz (67.858 kg)  BMI 23.96 kg/m2  SpO2 97% General appearance: alert, cooperative, appears stated age and no distress Head: Normocephalic, without obvious abnormality, atraumatic Eyes: conjunctivae/corneas clear. PERRL, EOM's intact. Fundi benign. Ears: normal TM's and external ear canals both ears Nose: Nares normal. Septum midline. Mucosa normal. No drainage or sinus tenderness. Throat: lips, mucosa, and tongue normal; teeth and gums normal Neck: no adenopathy, no carotid bruit, no JVD, supple, symmetrical, trachea midline and thyroid not enlarged, symmetric, no tenderness/mass/nodules Back: symmetric, no curvature. ROM normal. No CVA tenderness. Lungs: clear to auscultation bilaterally Breasts: gyn Heart: regular rate and rhythm, S1, S2 normal, no murmur, click, rub or gallop Abdomen: soft, non-tender; bowel sounds normal; no masses,  no organomegaly Pelvic: gyn Extremities: extremities normal, atraumatic, no cyanosis or edema Pulses: 2+ and symmetric Skin: Skin color, texture, turgor normal. No rashes or lesions Lymph nodes: Cervical, supraclavicular, and axillary nodes normal. Neurologic: Alert and oriented X 3, normal strength and tone. Normal symmetric reflexes. Normal coordination and gait psych--no anxiety / depression    Assessment:    Healthy female exam.     postmenopausal Plan:    ghm utd  Check fasting labs See After Visit Summary for Counseling Recommendations

## 2011-10-08 NOTE — Patient Instructions (Signed)
Preventive Care for Adults, Female A healthy lifestyle and preventive care can promote health and wellness. Preventive health guidelines for women include the following key practices.  A routine yearly physical is a good way to check with your caregiver about your health and preventive screening. It is a chance to share any concerns and updates on your health, and to receive a thorough exam.   Visit your dentist for a routine exam and preventive care every 6 months. Brush your teeth twice a day and floss once a day. Good oral hygiene prevents tooth decay and gum disease.   The frequency of eye exams is based on your age, health, family medical history, use of contact lenses, and other factors. Follow your caregiver's recommendations for frequency of eye exams.   Eat a healthy diet. Foods like vegetables, fruits, whole grains, low-fat dairy products, and lean protein foods contain the nutrients you need without too many calories. Decrease your intake of foods high in solid fats, added sugars, and salt. Eat the right amount of calories for you.Get information about a proper diet from your caregiver, if necessary.   Regular physical exercise is one of the most important things you can do for your health. Most adults should get at least 150 minutes of moderate-intensity exercise (any activity that increases your heart rate and causes you to sweat) each week. In addition, most adults need muscle-strengthening exercises on 2 or more days a week.   Maintain a healthy weight. The body mass index (BMI) is a screening tool to identify possible weight problems. It provides an estimate of body fat based on height and weight. Your caregiver can help determine your BMI, and can help you achieve or maintain a healthy weight.For adults 20 years and older:   A BMI below 18.5 is considered underweight.   A BMI of 18.5 to 24.9 is normal.   A BMI of 25 to 29.9 is considered overweight.   A BMI of 30 and above is  considered obese.   Maintain normal blood lipids and cholesterol levels by exercising and minimizing your intake of saturated fat. Eat a balanced diet with plenty of fruit and vegetables. Blood tests for lipids and cholesterol should begin at age 20 and be repeated every 5 years. If your lipid or cholesterol levels are high, you are over 50, or you are at high risk for heart disease, you may need your cholesterol levels checked more frequently.Ongoing high lipid and cholesterol levels should be treated with medicines if diet and exercise are not effective.   If you smoke, find out from your caregiver how to quit. If you do not use tobacco, do not start.   If you are pregnant, do not drink alcohol. If you are breastfeeding, be very cautious about drinking alcohol. If you are not pregnant and choose to drink alcohol, do not exceed 1 drink per day. One drink is considered to be 12 ounces (355 mL) of beer, 5 ounces (148 mL) of wine, or 1.5 ounces (44 mL) of liquor.   Avoid use of street drugs. Do not share needles with anyone. Ask for help if you need support or instructions about stopping the use of drugs.   High blood pressure causes heart disease and increases the risk of stroke. Your blood pressure should be checked at least every 1 to 2 years. Ongoing high blood pressure should be treated with medicines if weight loss and exercise are not effective.   If you are 55 to 57   years old, ask your caregiver if you should take aspirin to prevent strokes.   Diabetes screening involves taking a blood sample to check your fasting blood sugar level. This should be done once every 3 years, after age 45, if you are within normal weight and without risk factors for diabetes. Testing should be considered at a younger age or be carried out more frequently if you are overweight and have at least 1 risk factor for diabetes.   Breast cancer screening is essential preventive care for women. You should practice "breast  self-awareness." This means understanding the normal appearance and feel of your breasts and may include breast self-examination. Any changes detected, no matter how small, should be reported to a caregiver. Women in their 20s and 30s should have a clinical breast exam (CBE) by a caregiver as part of a regular health exam every 1 to 3 years. After age 40, women should have a CBE every year. Starting at age 40, women should consider having a mammography (breast X-ray test) every year. Women who have a family history of breast cancer should talk to their caregiver about genetic screening. Women at a high risk of breast cancer should talk to their caregivers about having magnetic resonance imaging (MRI) and a mammography every year.   The Pap test is a screening test for cervical cancer. A Pap test can show cell changes on the cervix that might become cervical cancer if left untreated. A Pap test is a procedure in which cells are obtained and examined from the lower end of the uterus (cervix).   Women should have a Pap test starting at age 21.   Between ages 21 and 29, Pap tests should be repeated every 2 years.   Beginning at age 30, you should have a Pap test every 3 years as long as the past 3 Pap tests have been normal.   Some women have medical problems that increase the chance of getting cervical cancer. Talk to your caregiver about these problems. It is especially important to talk to your caregiver if a new problem develops soon after your last Pap test. In these cases, your caregiver may recommend more frequent screening and Pap tests.   The above recommendations are the same for women who have or have not gotten the vaccine for human papillomavirus (HPV).   If you had a hysterectomy for a problem that was not cancer or a condition that could lead to cancer, then you no longer need Pap tests. Even if you no longer need a Pap test, a regular exam is a good idea to make sure no other problems are  starting.   If you are between ages 65 and 70, and you have had normal Pap tests going back 10 years, you no longer need Pap tests. Even if you no longer need a Pap test, a regular exam is a good idea to make sure no other problems are starting.   If you have had past treatment for cervical cancer or a condition that could lead to cancer, you need Pap tests and screening for cancer for at least 20 years after your treatment.   If Pap tests have been discontinued, risk factors (such as a new sexual partner) need to be reassessed to determine if screening should be resumed.   The HPV test is an additional test that may be used for cervical cancer screening. The HPV test looks for the virus that can cause the cell changes on the cervix.   The cells collected during the Pap test can be tested for HPV. The HPV test could be used to screen women aged 30 years and older, and should be used in women of any age who have unclear Pap test results. After the age of 30, women should have HPV testing at the same frequency as a Pap test.   Colorectal cancer can be detected and often prevented. Most routine colorectal cancer screening begins at the age of 50 and continues through age 75. However, your caregiver may recommend screening at an earlier age if you have risk factors for colon cancer. On a yearly basis, your caregiver may provide home test kits to check for hidden blood in the stool. Use of a small camera at the end of a tube, to directly examine the colon (sigmoidoscopy or colonoscopy), can detect the earliest forms of colorectal cancer. Talk to your caregiver about this at age 50, when routine screening begins. Direct examination of the colon should be repeated every 5 to 10 years through age 75, unless early forms of pre-cancerous polyps or small growths are found.   Hepatitis C blood testing is recommended for all people born from 1945 through 1965 and any individual with known risks for hepatitis C.    Practice safe sex. Use condoms and avoid high-risk sexual practices to reduce the spread of sexually transmitted infections (STIs). STIs include gonorrhea, chlamydia, syphilis, trichomonas, herpes, HPV, and human immunodeficiency virus (HIV). Herpes, HIV, and HPV are viral illnesses that have no cure. They can result in disability, cancer, and death. Sexually active women aged 25 and younger should be checked for chlamydia. Older women with new or multiple partners should also be tested for chlamydia. Testing for other STIs is recommended if you are sexually active and at increased risk.   Osteoporosis is a disease in which the bones lose minerals and strength with aging. This can result in serious bone fractures. The risk of osteoporosis can be identified using a bone density scan. Women ages 65 and over and women at risk for fractures or osteoporosis should discuss screening with their caregivers. Ask your caregiver whether you should take a calcium supplement or vitamin D to reduce the rate of osteoporosis.   Menopause can be associated with physical symptoms and risks. Hormone replacement therapy is available to decrease symptoms and risks. You should talk to your caregiver about whether hormone replacement therapy is right for you.   Use sunscreen with sun protection factor (SPF) of 30 or more. Apply sunscreen liberally and repeatedly throughout the day. You should seek shade when your shadow is shorter than you. Protect yourself by wearing long sleeves, pants, a wide-brimmed hat, and sunglasses year round, whenever you are outdoors.   Once a month, do a whole body skin exam, using a mirror to look at the skin on your back. Notify your caregiver of new moles, moles that have irregular borders, moles that are larger than a pencil eraser, or moles that have changed in shape or color.   Stay current with required immunizations.   Influenza. You need a dose every fall (or winter). The composition of  the flu vaccine changes each year, so being vaccinated once is not enough.   Pneumococcal polysaccharide. You need 1 to 2 doses if you smoke cigarettes or if you have certain chronic medical conditions. You need 1 dose at age 65 (or older) if you have never been vaccinated.   Tetanus, diphtheria, pertussis (Tdap, Td). Get 1 dose of   Tdap vaccine if you are younger than age 65, are over 65 and have contact with an infant, are a healthcare worker, are pregnant, or simply want to be protected from whooping cough. After that, you need a Td booster dose every 10 years. Consult your caregiver if you have not had at least 3 tetanus and diphtheria-containing shots sometime in your life or have a deep or dirty wound.   HPV. You need this vaccine if you are a woman age 26 or younger. The vaccine is given in 3 doses over 6 months.   Measles, mumps, rubella (MMR). You need at least 1 dose of MMR if you were born in 1957 or later. You may also need a second dose.   Meningococcal. If you are age 19 to 21 and a first-year college student living in a residence hall, or have one of several medical conditions, you need to get vaccinated against meningococcal disease. You may also need additional booster doses.   Zoster (shingles). If you are age 60 or older, you should get this vaccine.   Varicella (chickenpox). If you have never had chickenpox or you were vaccinated but received only 1 dose, talk to your caregiver to find out if you need this vaccine.   Hepatitis A. You need this vaccine if you have a specific risk factor for hepatitis A virus infection or you simply wish to be protected from this disease. The vaccine is usually given as 2 doses, 6 to 18 months apart.   Hepatitis B. You need this vaccine if you have a specific risk factor for hepatitis B virus infection or you simply wish to be protected from this disease. The vaccine is given in 3 doses, usually over 6 months.  Preventive Services /  Frequency Ages 19 to 39  Blood pressure check.** / Every 1 to 2 years.   Lipid and cholesterol check.** / Every 5 years beginning at age 20.   Clinical breast exam.** / Every 3 years for women in their 20s and 30s.   Pap test.** / Every 2 years from ages 21 through 29. Every 3 years starting at age 30 through age 65 or 70 with a history of 3 consecutive normal Pap tests.   HPV screening.** / Every 3 years from ages 30 through ages 65 to 70 with a history of 3 consecutive normal Pap tests.   Hepatitis C blood test.** / For any individual with known risks for hepatitis C.   Skin self-exam. / Monthly.   Influenza immunization.** / Every year.   Pneumococcal polysaccharide immunization.** / 1 to 2 doses if you smoke cigarettes or if you have certain chronic medical conditions.   Tetanus, diphtheria, pertussis (Tdap, Td) immunization. / A one-time dose of Tdap vaccine. After that, you need a Td booster dose every 10 years.   HPV immunization. / 3 doses over 6 months, if you are 26 and younger.   Measles, mumps, rubella (MMR) immunization. / You need at least 1 dose of MMR if you were born in 1957 or later. You may also need a second dose.   Meningococcal immunization. / 1 dose if you are age 19 to 21 and a first-year college student living in a residence hall, or have one of several medical conditions, you need to get vaccinated against meningococcal disease. You may also need additional booster doses.   Varicella immunization.** / Consult your caregiver.   Hepatitis A immunization.** / Consult your caregiver. 2 doses, 6 to 18 months   apart.   Hepatitis B immunization.** / Consult your caregiver. 3 doses usually over 6 months.  Ages 40 to 64  Blood pressure check.** / Every 1 to 2 years.   Lipid and cholesterol check.** / Every 5 years beginning at age 20.   Clinical breast exam.** / Every year after age 40.   Mammogram.** / Every year beginning at age 40 and continuing for as  long as you are in good health. Consult with your caregiver.   Pap test.** / Every 3 years starting at age 30 through age 65 or 70 with a history of 3 consecutive normal Pap tests.   HPV screening.** / Every 3 years from ages 30 through ages 65 to 70 with a history of 3 consecutive normal Pap tests.   Fecal occult blood test (FOBT) of stool. / Every year beginning at age 50 and continuing until age 75. You may not need to do this test if you get a colonoscopy every 10 years.   Flexible sigmoidoscopy or colonoscopy.** / Every 5 years for a flexible sigmoidoscopy or every 10 years for a colonoscopy beginning at age 50 and continuing until age 75.   Hepatitis C blood test.** / For all people born from 1945 through 1965 and any individual with known risks for hepatitis C.   Skin self-exam. / Monthly.   Influenza immunization.** / Every year.   Pneumococcal polysaccharide immunization.** / 1 to 2 doses if you smoke cigarettes or if you have certain chronic medical conditions.   Tetanus, diphtheria, pertussis (Tdap, Td) immunization.** / A one-time dose of Tdap vaccine. After that, you need a Td booster dose every 10 years.   Measles, mumps, rubella (MMR) immunization. / You need at least 1 dose of MMR if you were born in 1957 or later. You may also need a second dose.   Varicella immunization.** / Consult your caregiver.   Meningococcal immunization.** / Consult your caregiver.   Hepatitis A immunization.** / Consult your caregiver. 2 doses, 6 to 18 months apart.   Hepatitis B immunization.** / Consult your caregiver. 3 doses, usually over 6 months.  Ages 65 and over  Blood pressure check.** / Every 1 to 2 years.   Lipid and cholesterol check.** / Every 5 years beginning at age 20.   Clinical breast exam.** / Every year after age 40.   Mammogram.** / Every year beginning at age 40 and continuing for as long as you are in good health. Consult with your caregiver.   Pap test.** /  Every 3 years starting at age 30 through age 65 or 70 with a 3 consecutive normal Pap tests. Testing can be stopped between 65 and 70 with 3 consecutive normal Pap tests and no abnormal Pap or HPV tests in the past 10 years.   HPV screening.** / Every 3 years from ages 30 through ages 65 or 70 with a history of 3 consecutive normal Pap tests. Testing can be stopped between 65 and 70 with 3 consecutive normal Pap tests and no abnormal Pap or HPV tests in the past 10 years.   Fecal occult blood test (FOBT) of stool. / Every year beginning at age 50 and continuing until age 75. You may not need to do this test if you get a colonoscopy every 10 years.   Flexible sigmoidoscopy or colonoscopy.** / Every 5 years for a flexible sigmoidoscopy or every 10 years for a colonoscopy beginning at age 50 and continuing until age 75.   Hepatitis   C blood test.** / For all people born from 1945 through 1965 and any individual with known risks for hepatitis C.   Osteoporosis screening.** / A one-time screening for women ages 65 and over and women at risk for fractures or osteoporosis.   Skin self-exam. / Monthly.   Influenza immunization.** / Every year.   Pneumococcal polysaccharide immunization.** / 1 dose at age 65 (or older) if you have never been vaccinated.   Tetanus, diphtheria, pertussis (Tdap, Td) immunization. / A one-time dose of Tdap vaccine if you are over 65 and have contact with an infant, are a healthcare worker, or simply want to be protected from whooping cough. After that, you need a Td booster dose every 10 years.   Varicella immunization.** / Consult your caregiver.   Meningococcal immunization.** / Consult your caregiver.   Hepatitis A immunization.** / Consult your caregiver. 2 doses, 6 to 18 months apart.   Hepatitis B immunization.** / Check with your caregiver. 3 doses, usually over 6 months.  ** Family history and personal history of risk and conditions may change your caregiver's  recommendations. Document Released: 07/21/2001 Document Revised: 05/14/2011 Document Reviewed: 10/20/2010 [employer] Patient Information 2012 [employer]. 

## 2012-02-04 ENCOUNTER — Other Ambulatory Visit: Payer: Self-pay | Admitting: Obstetrics & Gynecology

## 2012-02-04 DIAGNOSIS — Z1231 Encounter for screening mammogram for malignant neoplasm of breast: Secondary | ICD-10-CM

## 2012-02-16 ENCOUNTER — Other Ambulatory Visit: Payer: Self-pay | Admitting: Orthopedic Surgery

## 2012-02-22 ENCOUNTER — Ambulatory Visit
Admission: RE | Admit: 2012-02-22 | Discharge: 2012-02-22 | Disposition: A | Discharge status: Home / Self Care | Payer: BC Managed Care – PPO | Source: Ambulatory Visit | Attending: Obstetrics & Gynecology | Admitting: Obstetrics & Gynecology

## 2012-02-22 DIAGNOSIS — Z1231 Encounter for screening mammogram for malignant neoplasm of breast: Secondary | ICD-10-CM

## 2012-02-23 ENCOUNTER — Ambulatory Visit (HOSPITAL_BASED_OUTPATIENT_CLINIC_OR_DEPARTMENT_OTHER)
Admission: RE | Admit: 2012-02-23 | Discharge: 2012-02-23 | Disposition: A | Discharge status: Home / Self Care | Payer: BC Managed Care – PPO | Source: Ambulatory Visit | Attending: Orthopedic Surgery | Admitting: Orthopedic Surgery

## 2012-02-23 ENCOUNTER — Encounter (HOSPITAL_BASED_OUTPATIENT_CLINIC_OR_DEPARTMENT_OTHER)
Admission: RE | Disposition: A | Discharge status: Home / Self Care | Payer: Self-pay | Source: Ambulatory Visit | Attending: Orthopedic Surgery

## 2012-02-23 DIAGNOSIS — M19049 Primary osteoarthritis, unspecified hand: Secondary | ICD-10-CM | POA: Insufficient documentation

## 2012-02-23 DIAGNOSIS — M6740 Ganglion, unspecified site: Secondary | ICD-10-CM | POA: Insufficient documentation

## 2012-02-23 HISTORY — PX: MASS EXCISION: SHX2000

## 2012-02-23 LAB — HM DEXA SCAN

## 2012-02-23 SURGERY — MINOR EXCISION OF MASS
Anesthesia: LOCAL | Site: Finger | Laterality: Left | Wound class: Clean

## 2012-02-23 MED ORDER — CEPHALEXIN 500 MG PO CAPS: 500.0000 mg | ORAL_CAPSULE | Freq: Three times a day (TID) | ORAL | Status: DC

## 2012-02-23 MED ORDER — HYDROCODONE-ACETAMINOPHEN 5-325 MG PO TABS: ORAL_TABLET | ORAL | Status: DC

## 2012-02-23 MED ORDER — LIDOCAINE HCL 2 % IJ SOLN
INTRAMUSCULAR | Status: DC | PRN
  Administered 2012-02-23: 4.5 mL

## 2012-02-23 MED ORDER — CHLORHEXIDINE GLUCONATE 4 % EX LIQD: 60.0000 mL | Freq: Once | CUTANEOUS | Status: DC

## 2012-02-23 SURGICAL SUPPLY — 41 items
BANDAGE ADHESIVE 1X3 (GAUZE/BANDAGES/DRESSINGS) IMPLANT
BLADE SURG 15 STRL LF DISP TIS (BLADE) ×1 IMPLANT
BLADE SURG 15 STRL SS (BLADE) ×1
BNDG COHESIVE 1X5 TAN STRL LF (GAUZE/BANDAGES/DRESSINGS) ×2 IMPLANT
BNDG ELASTIC 2 VLCR STRL LF (GAUZE/BANDAGES/DRESSINGS) IMPLANT
BNDG ESMARK 4X9 LF (GAUZE/BANDAGES/DRESSINGS) IMPLANT
BRUSH SCRUB EZ PLAIN DRY (MISCELLANEOUS) ×2 IMPLANT
CLOTH BEACON ORANGE TIMEOUT ST (SAFETY) ×2 IMPLANT
CORDS BIPOLAR (ELECTRODE) IMPLANT
COVER MAYO STAND STRL (DRAPES) ×2 IMPLANT
CUFF TOURNIQUET SINGLE 18IN (TOURNIQUET CUFF) IMPLANT
DECANTER SPIKE VIAL GLASS SM (MISCELLANEOUS) IMPLANT
DRAIN PENROSE 1/2X12 LTX STRL (WOUND CARE) ×2 IMPLANT
DRAPE SURG 17X23 STRL (DRAPES) ×2 IMPLANT
GAUZE SPONGE 4X4 12PLY STRL LF (GAUZE/BANDAGES/DRESSINGS) IMPLANT
GAUZE XEROFORM 1X8 LF (GAUZE/BANDAGES/DRESSINGS) ×2 IMPLANT
GLOVE BIO SURGEON STRL SZ 6.5 (GLOVE) ×2 IMPLANT
GLOVE BIOGEL M STRL SZ7.5 (GLOVE) ×2 IMPLANT
GLOVE EXAM NITRILE EXT CUFF MD (GLOVE) ×2 IMPLANT
GLOVE ORTHO TXT STRL SZ7.5 (GLOVE) ×2 IMPLANT
GOWN PREVENTION PLUS XLARGE (GOWN DISPOSABLE) IMPLANT
GOWN STRL REIN 2XL XLG LVL4 (GOWN DISPOSABLE) ×4 IMPLANT
NDL SAFETY ECLIPSE 18X1.5 (NEEDLE) ×1 IMPLANT
NEEDLE 27GAX1X1/2 (NEEDLE) ×2 IMPLANT
NEEDLE BLUNT 17GA (NEEDLE) ×2 IMPLANT
NEEDLE HYPO 18GX1.5 SHARP (NEEDLE) ×1
NS IRRIG 1000ML POUR BTL (IV SOLUTION) ×2 IMPLANT
PACK BASIN DAY SURGERY FS (CUSTOM PROCEDURE TRAY) ×2 IMPLANT
PADDING CAST ABS 4INX4YD NS (CAST SUPPLIES)
PADDING CAST ABS COTTON 4X4 ST (CAST SUPPLIES) IMPLANT
SPONGE GAUZE 4X4 12PLY (GAUZE/BANDAGES/DRESSINGS) ×2 IMPLANT
STOCKINETTE 4X48 STRL (DRAPES) ×2 IMPLANT
SUT ETHILON 5 0 P 3 18 (SUTURE) ×1
SUT NYLON ETHILON 5-0 P-3 1X18 (SUTURE) ×1 IMPLANT
SYR 20CC LL (SYRINGE) ×2 IMPLANT
SYR 3ML 23GX1 SAFETY (SYRINGE) IMPLANT
SYR CONTROL 10ML LL (SYRINGE) ×2 IMPLANT
TOWEL OR 17X24 6PK STRL BLUE (TOWEL DISPOSABLE) ×2 IMPLANT
TRAY DSU PREP LF (CUSTOM PROCEDURE TRAY) ×2 IMPLANT
UNDERPAD 30X30 INCONTINENT (UNDERPADS AND DIAPERS) ×2 IMPLANT
WATER STERILE IRR 1000ML POUR (IV SOLUTION) IMPLANT

## 2012-02-23 NOTE — Brief Op Note (Signed)
02/23/2012  12:52 PM  PATIENT:  Anita Galloway  56 y.o. 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  PROCEDURE:  Procedure(s) (LRB) with comments: MINOR EXCISION OF MASS (Left) - excision cyst left long finger  SURGEON:  Surgeon(s) and Role:    * Wyn Forster., MD - Primary  PHYSICIAN ASSISTANT:   ASSISTANTS: Mallory [email or web address]-C    ANESTHESIA:   local  EBL:     BLOOD ADMINISTERED:none  DRAINS: none   LOCAL MEDICATIONS USED:  XYLOCAINE   SPECIMEN:  No Specimen  DISPOSITION OF SPECIMEN:  N/A  COUNTS:  YES  TOURNIQUET:  * Missing tourniquet times found for documented tourniquets in log:  [id number] *  DICTATION: .Other Dictation: Dictation Number [id number]  PLAN OF CARE: Discharge to home after PACU  PATIENT DISPOSITION:  PACU - hemodynamically stable.

## 2012-02-23 NOTE — Op Note (Signed)
[id number] 

## 2012-02-23 NOTE — H&P (Signed)
  Anita Galloway presents for evaluation of a mucoid cyst involving her index and long finger dorsal radial aspects. In the index finger she has a full length nail groove involving the radial third of her nail plate. In the long finger she has a healing lesion over the dorsal radial aspect of the joint. In February Dr. Amy [place] performed a superficial debridement of a mucoid cyst at the left long finger dorsal radial aspect. She told Anita Galloway that she would likely need to see an upper extremity orthopaedic consult if her symptoms did not resolve.   Anita Galloway presents for follow up at this time. We last saw her for evaluation and management of a frozen shoulder in December of 2009. Her past history is updated. She has no drug allergies. Her only current medications include vitamins. She has had prior C-section in 1987 and 1989. Her social history reveals she is married. She is a nonsmoker. She enjoys a rare alcoholic beverage. Her family history is detailed and positive for her father having diabetes and coronary artery disease. A 14 system review of systems reveals corrective lenses.  Physical exam reveals a healing mucoid cyst debridement wound of the left long finger dorsal radial aspect and a sizable mucoid cyst at the dorsal radial aspect of the index DIP joint that extends over the dorsal aspect of the radial nail fold. Anita Galloway has a significant nail groove.   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 There is a loose body in the index DIP joint visible on the AP and oblique images.   Assessment: Mucoid cyst associated with degenerative arthritis of the distal IP joints left index and long fingers.  Plan: She will try retrograde massage and Coban wrapping for several weeks. If this is unsuccessful at some point she would benefit from DIP joint debridement of the index and long fingers and nail decompression. The surgery, after care, risks  and benefits were described in detail. Questions regarding the surgery were invited and answered in detail.  H&P documentation: 02/23/2012  -History and Physical Reviewed  -Patient has been re-examined  -No change in the plan of care  Wyn Forster, MD

## 2012-02-24 ENCOUNTER — Encounter (HOSPITAL_BASED_OUTPATIENT_CLINIC_OR_DEPARTMENT_OTHER): Payer: Self-pay | Admitting: Orthopedic Surgery

## 2012-02-24 NOTE — Op Note (Signed)
NAMEKEYRA, VIRELLA               ACCOUNT NO.:  [id number]  MEDICAL RECORD NO.:  [id number]  LOCATION:                                 FACILITY:  PHYSICIAN:  Katy Fitch. Toy Samarin, M.D. DATE OF BIRTH:  1956/05/12  DATE OF PROCEDURE:  02/23/2012 DATE OF DISCHARGE:                              OPERATIVE REPORT   Nanetta Batty. Paull.  PREOPERATIVE DIAGNOSES:  Large mucoid cyst, dorsal radial aspect of left long finger adjacent to distal interphalangeal joint, dorsal radial aspect, also history of left index finger, mucoid cyst improved after retrograde massage and compression wrapping with Coban.  POSTOPERATIVE DIAGNOSES:  Large mucoid cyst, dorsal radial aspect of left long finger adjacent to distal interphalangeal joint, dorsal radial aspect, also history of left index finger, mucoid cyst improved after retrograde massage and compression wrapping with Coban.  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  OPERATION:  Debridement of mucoid cyst with DIP joint arthrotomy and removal of loose bodies, synovectomy, and removal of marginal osteophytes from base of distal phalanx.  OPERATING SURGEON:  Katy Fitch. Leila Schuff, MD  ASSISTANT:  Marveen Reeks Dasnoit, PA-C  ANESTHESIA:  Lidocaine 2% metacarpal head level block of left long finger.  This was performed as a minor operating room procedure.  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  In May of 2013, we saw her for consult and advised attempted retrograde massage.  Plain x-rays demonstrated osteoarthritis of the distal interphalangeal joints of her index and long fingers.  She returns at this time with a large nearly 1 cm in length mucoid cyst at dorsal radial aspect of her left long finger.  Her left index finger mucoid cyst involuted with retrograde massage and  compression wrapping. Holly requested that we debride the joint as we discussed in the office.  PROCEDURE:  Kala Ambriz was brought to room 6 of the [facility] and placed in supine position on the operating table.  Following informed consent and alcohol Betadine prep, a 2% lidocaine metacarpal head level block was placed without complication.  Excellent anesthesia of the left long finger was achieved.  The left hand and arm were then prepped with Betadine soap and solution, sterilely draped.  Upon exsanguination of the left long finger with a gauze wrap, a half-inch Penrose drain was placed as a digital tourniquet at the base of the proximal phalanx.  A routine surgical time-out was accomplished followed by incision directly over the cyst.  The cyst contents were drained and the membrane of the cyst excised.  The base of the cyst was resected down the level of the capsule of the distal interphalangeal joint.  A triangular capsulotomy was performed removing the capsule between the terminal extensor tendon and the radial collateral ligament.  A micro curette was used to remove marginal osteophytes.  Several small loose bodies were excised.  A Henner micro elevator was used to clear the distal interphalangeal joint followed by thorough irrigation with a 19-gauge blunt dental needle and a syringe of sterile saline.  We very carefully inspected the joint and found advanced osteoarthritis of the hyaline articular cartilage surfaces of the distal and middle phalangeal surfaces.  We removed the marginal osteophytes and left the radial collateral ligament intact.  The base of the cyst was debrided to try to remove the sinus tract from the joint.  The wound was then repaired with interrupted suture of 5-0 nylon.  The wound was dressed with Xeroflo, sterile gauze, and a Coban finger dressing.  There were no apparent complications.  For aftercare, Marlisha Zidek is provided  prescriptions for Keflex 500 mg 1 p.o. q.8 hours x4 days as a prophylactic antibiotic due to joint entry.  Also she is provided Vicodin 5 mg 1 p.o. q.4 hours p.r.n. pain, 20 tablets without refill.  We will see her back in followup in 1 week for suture removal.     Katy Fitch. Carrington Mullenax, M.D.     RVS/MEDQ  D:  02/23/2012  T:  02/24/2012  Job:  [id number]

## 2012-02-26 ENCOUNTER — Other Ambulatory Visit: Payer: Self-pay | Admitting: Obstetrics & Gynecology

## 2012-02-26 DIAGNOSIS — R928 Other abnormal and inconclusive findings on diagnostic imaging of breast: Secondary | ICD-10-CM

## 2012-03-02 ENCOUNTER — Ambulatory Visit
Admission: RE | Admit: 2012-03-02 | Discharge: 2012-03-02 | Disposition: A | Discharge status: Home / Self Care | Payer: BC Managed Care – PPO | Source: Ambulatory Visit | Attending: Obstetrics & Gynecology | Admitting: Obstetrics & Gynecology

## 2012-03-02 DIAGNOSIS — R928 Other abnormal and inconclusive findings on diagnostic imaging of breast: Secondary | ICD-10-CM

## 2012-07-23 ENCOUNTER — Other Ambulatory Visit: Payer: Self-pay

## 2013-01-23 ENCOUNTER — Other Ambulatory Visit: Payer: Self-pay

## 2013-01-23 DIAGNOSIS — Z1231 Encounter for screening mammogram for malignant neoplasm of breast: Secondary | ICD-10-CM

## 2013-02-28 ENCOUNTER — Ambulatory Visit
Admission: RE | Admit: 2013-02-28 | Discharge: 2013-02-28 | Disposition: A | Discharge status: Home / Self Care | Payer: BC Managed Care – PPO | Source: Ambulatory Visit

## 2013-02-28 DIAGNOSIS — Z1231 Encounter for screening mammogram for malignant neoplasm of breast: Secondary | ICD-10-CM

## 2013-04-13 ENCOUNTER — Other Ambulatory Visit: Payer: Self-pay

## 2013-09-26 ENCOUNTER — Telehealth: Payer: Self-pay

## 2013-09-26 NOTE — Telephone Encounter (Addendum)
Left message for call back Non-identifiable   Pap- 03/09/11 [facility]CS- 03/07/08- diverticulosis MMG- 02/28/13-negative BD Flu Td- 09/05/04

## 2013-09-26 NOTE — Telephone Encounter (Signed)
Patient called back, update allergy, med list as well as added pharmacy.

## 2013-09-27 ENCOUNTER — Encounter: Payer: Self-pay | Admitting: Family Medicine

## 2013-09-27 ENCOUNTER — Ambulatory Visit (INDEPENDENT_AMBULATORY_CARE_PROVIDER_SITE_OTHER): Payer: BC Managed Care – PPO | Admitting: Family Medicine

## 2013-09-27 VITALS — BP 120/82 | HR 71 | Temp 97.6°F | Ht 66.0 in | Wt 152.0 lb

## 2013-09-27 DIAGNOSIS — Z0000 Encounter for general adult medical examination without abnormal findings: Secondary | ICD-10-CM

## 2013-09-27 DIAGNOSIS — M949 Disorder of cartilage, unspecified: Secondary | ICD-10-CM

## 2013-09-27 DIAGNOSIS — M8580 Other specified disorders of bone density and structure, unspecified site: Secondary | ICD-10-CM

## 2013-09-27 DIAGNOSIS — M899 Disorder of bone, unspecified: Secondary | ICD-10-CM

## 2013-09-27 LAB — CBC WITH DIFFERENTIAL/PLATELET
Basophils Absolute: 0 10*3/uL (ref 0.0–0.1)
Basophils Relative: 0.4 % (ref 0.0–3.0)
Eosinophils Absolute: 0.2 10*3/uL (ref 0.0–0.7)
Eosinophils Relative: 2.5 % (ref 0.0–5.0)
HCT: 40.5 % (ref 36.0–46.0)
Hemoglobin: 13.7 g/dL (ref 12.0–15.0)
Lymphocytes Relative: 23.9 % (ref 12.0–46.0)
Lymphs Abs: 2 10*3/uL (ref 0.7–4.0)
MCHC: 33.9 g/dL (ref 30.0–36.0)
MCV: 95.9 fl (ref 78.0–100.0)
Monocytes Absolute: 0.6 10*3/uL (ref 0.1–1.0)
Monocytes Relative: 6.9 % (ref 3.0–12.0)
Neutro Abs: 5.4 10*3/uL (ref 1.4–7.7)
Neutrophils Relative %: 66.3 % (ref 43.0–77.0)
Platelets: 334 10*3/uL (ref 150.0–400.0)
RBC: 4.22 Mil/uL (ref 3.87–5.11)
RDW: 13.1 % (ref 11.5–14.6)
WBC: 8.2 10*3/uL (ref 4.5–10.5)

## 2013-09-27 LAB — BASIC METABOLIC PANEL
BUN: 13 mg/dL (ref 6–23)
CO2: 28 mEq/L (ref 19–32)
Calcium: 9.2 mg/dL (ref 8.4–10.5)
Chloride: 104 mEq/L (ref 96–112)
Creatinine, Ser: 0.8 mg/dL (ref 0.4–1.2)
GFR: 77.09 mL/min (ref >60.00)
Glucose, Bld: 84 mg/dL (ref 70–99)
Potassium: 4.4 mEq/L (ref 3.5–5.1)
Sodium: 140 mEq/L (ref 135–145)

## 2013-09-27 LAB — HEPATIC FUNCTION PANEL
ALT: 20 U/L (ref 0–35)
AST: 25 U/L (ref 0–37)
Albumin: 4.3 g/dL (ref 3.5–5.2)
Alkaline Phosphatase: 56 U/L (ref 39–117)
Bilirubin, Direct: 0 mg/dL (ref 0.0–0.3)
Total Bilirubin: 0.6 mg/dL (ref 0.3–1.2)
Total Protein: 6.9 g/dL (ref 6.0–8.3)

## 2013-09-27 LAB — TSH: TSH: 1.86 u[IU]/mL (ref 0.35–5.50)

## 2013-09-27 LAB — LIPID PANEL
Cholesterol: 208 mg/dL — ABNORMAL HIGH (ref 0–200)
HDL: 87.6 mg/dL (ref >39.00)
LDL Cholesterol: 104 mg/dL — ABNORMAL HIGH (ref 0–99)
Total CHOL/HDL Ratio: 2
Triglycerides: 81 mg/dL (ref 0.0–149.0)
VLDL: 16.2 mg/dL (ref 0.0–40.0)

## 2013-09-27 LAB — POCT URINALYSIS DIPSTICK
Bilirubin, UA: NEGATIVE
Blood, UA: NEGATIVE
Glucose, UA: NEGATIVE
Ketones, UA: NEGATIVE
Leukocytes, UA: NEGATIVE
Nitrite, UA: NEGATIVE
Protein, UA: NEGATIVE
Spec Grav, UA: 1.015
Urobilinogen, UA: 0.2
pH, UA: 6.5

## 2013-09-27 NOTE — Progress Notes (Signed)
Subjective:     Anita Galloway is a 58 y.o. female and is here for a comprehensive physical exam. The patient reports no problems.  History   Social History  . Marital Status: Married    Spouse Name: N/A    Number of Children: N/A  . Years of Education: N/A   Occupational History  . [employer] -- controller    Social History Main Topics  . Smoking status: Never Smoker   . Smokeless tobacco: Never Used  . Alcohol Use: Yes  . Drug Use: No  . Sexual Activity: Yes    Partners: Male   Other Topics Concern  . Not on file   Social History Narrative   Exercising-- treadmill, walk, elliptical   Health Maintenance  Topic Date Due  . Influenza Vaccine  01/06/2014  . Pap Smear  03/08/2014  . Tetanus/tdap  09/06/2014  . Mammogram  03/01/2015  . Colonoscopy  03/07/2018    The following portions of the patient's history were reviewed and updated as appropriate:  She  has a past medical history of Phlebitis. She  does not have any pertinent problems on file. She  has past surgical history that includes Cesarean section and Mass excision (02/23/2012). Her family history includes Alzheimer's disease in her mother; Breast cancer in an other family member; Coronary artery disease in an other family member; Diabetes in an other family member; Osteoporosis in her sister. She  reports that she has never smoked. She has never used smokeless tobacco. She reports that she drinks alcohol. She reports that she does not use illicit drugs. She has a current medication list which includes the following prescription(s): aspirin, calcium-vitamin d, cholecalciferol, minivelle, multivitamin, progesterone, thiamine, and zolpidem. Current Outpatient Prescriptions on File Prior to Visit  Medication Sig Dispense Refill  . aspirin 81 MG tablet Take 81 mg by mouth daily.      . Calcium Carbonate-Vitamin D (CALCIUM-VITAMIN D) 500-200 MG-UNIT per tablet Take 1 tablet by mouth daily.      .  cholecalciferol (VITAMIN D) 1000 UNITS tablet Take 1,000 Units by mouth daily.      . Multiple Vitamin (MULTIVITAMIN) tablet Take 1 tablet by mouth daily.      Marland Kitchen. thiamine (VITAMIN B-1) 100 MG tablet Take 100 mg by mouth daily.      Marland Kitchen. zolpidem (AMBIEN) 5 MG tablet Take 5 mg by mouth at bedtime as needed.       No current facility-administered medications on file prior to visit.   She has No Known Allergies..  Review of Systems Review of Systems  Constitutional: Negative for activity change, appetite change and fatigue.  HENT: Negative for hearing loss, congestion, tinnitus and ear discharge.  dentist q771m Eyes: Negative for visual disturbance (see optho q2y -- vision corrected to 20/20 with glasses).  Respiratory: Negative for cough, chest tightness and shortness of breath.   Cardiovascular: Negative for chest pain, palpitations and leg swelling.  Gastrointestinal: Negative for abdominal pain, diarrhea, constipation and abdominal distention.  Genitourinary: Negative for urgency, frequency, decreased urine volume and difficulty urinating.  Musculoskeletal: Negative for back pain, arthralgias and gait problem.  Skin: Negative for color change, pallor and rash.  Neurological: Negative for dizziness, light-headedness, numbness and headaches.  Hematological: Negative for adenopathy. Does not bruise/bleed easily.  Psychiatric/Behavioral: Negative for suicidal ideas, confusion, sleep disturbance, self-injury, dysphoric mood, decreased concentration and agitation.       Objective:    BP 120/82  Pulse 71  Temp(Src) 97.6 F (  36.4 C) (Oral)  Ht 5\' 6"  (1.676 m)  Wt 152 lb (68.947 kg)  BMI 24.55 kg/m2  SpO2 97% General appearance: alert, cooperative, appears stated age and no distress Head: Normocephalic, without obvious abnormality, atraumatic Eyes: conjunctivae/corneas clear. PERRL, EOM's intact. Fundi benign. Ears: normal TM's and external ear canals both ears Nose: Nares normal. Septum  midline. Mucosa normal. No drainage or sinus tenderness. Throat: lips, mucosa, and tongue normal; teeth and gums normal Neck: no adenopathy, no carotid bruit, no JVD, supple, symmetrical, trachea midline and thyroid not enlarged, symmetric, no tenderness/mass/nodules Back: symmetric, no curvature. ROM normal. No CVA tenderness. Lungs: clear to auscultation bilaterally Breasts: deferred Heart: regular rate and rhythm, S1, S2 normal, no murmur, click, rub or gallop Abdomen: soft, non-tender; bowel sounds normal; no masses,  no organomegaly Pelvic: deferred---gyn Extremities: extremities normal, atraumatic, no cyanosis or edema Pulses: 2+ and symmetric Skin: Skin color, texture, turgor normal. No rashes or lesions Lymph nodes: Cervical, supraclavicular, and axillary nodes normal. Neurologic: Alert and oriented X 3, normal strength and tone. Normal symmetric reflexes. Normal coordination and gait Psych--no depression, no anxiety      Assessment:    Healthy female exam.      Plan:    ghm utd Check labs See After Visit Summary for Counseling Recommendations

## 2013-09-27 NOTE — Patient Instructions (Signed)

## 2013-09-27 NOTE — Progress Notes (Signed)
Pre visit review using our clinic review tool, if applicable. No additional management support is needed unless otherwise documented below in the visit note. 

## 2013-11-06 HISTORY — PX: DILATION AND CURETTAGE OF UTERUS: SHX78

## 2014-02-22 ENCOUNTER — Other Ambulatory Visit: Payer: Self-pay

## 2014-02-22 DIAGNOSIS — Z1231 Encounter for screening mammogram for malignant neoplasm of breast: Secondary | ICD-10-CM

## 2014-03-06 ENCOUNTER — Ambulatory Visit
Admission: RE | Admit: 2014-03-06 | Discharge: 2014-03-06 | Disposition: A | Discharge status: Home / Self Care | Payer: BC Managed Care – PPO | Source: Ambulatory Visit

## 2014-03-06 DIAGNOSIS — Z1231 Encounter for screening mammogram for malignant neoplasm of breast: Secondary | ICD-10-CM

## 2014-09-21 ENCOUNTER — Telehealth: Payer: Self-pay | Admitting: Family Medicine

## 2014-09-21 NOTE — Telephone Encounter (Signed)
Pre Visit letter sent  °

## 2014-10-10 ENCOUNTER — Telehealth: Payer: Self-pay

## 2014-10-10 NOTE — Telephone Encounter (Signed)
Pre visit call completed 

## 2014-10-11 ENCOUNTER — Ambulatory Visit (INDEPENDENT_AMBULATORY_CARE_PROVIDER_SITE_OTHER): Payer: BLUE CROSS/BLUE SHIELD | Admitting: Family Medicine

## 2014-10-11 ENCOUNTER — Encounter: Payer: Self-pay | Admitting: Family Medicine

## 2014-10-11 VITALS — BP 114/76 | HR 71 | Temp 97.7°F | Ht 66.0 in | Wt 153.0 lb

## 2014-10-11 DIAGNOSIS — Z23 Encounter for immunization: Secondary | ICD-10-CM | POA: Diagnosis not present

## 2014-10-11 DIAGNOSIS — G4700 Insomnia, unspecified: Secondary | ICD-10-CM

## 2014-10-11 DIAGNOSIS — Z0000 Encounter for general adult medical examination without abnormal findings: Secondary | ICD-10-CM | POA: Diagnosis not present

## 2014-10-11 LAB — POCT URINALYSIS DIPSTICK
Bilirubin, UA: NEGATIVE
Blood, UA: NEGATIVE
Glucose, UA: NEGATIVE
Ketones, UA: NEGATIVE
Leukocytes, UA: NEGATIVE
Nitrite, UA: NEGATIVE
Protein, UA: NEGATIVE
Spec Grav, UA: >=1.03
Urobilinogen, UA: 4
pH, UA: 5.5

## 2014-10-11 LAB — HEPATIC FUNCTION PANEL
ALT: 21 U/L (ref 0–35)
AST: 22 U/L (ref 0–37)
Albumin: 4.3 g/dL (ref 3.5–5.2)
Alkaline Phosphatase: 60 U/L (ref 39–117)
Bilirubin, Direct: 0.1 mg/dL (ref 0.0–0.3)
Total Bilirubin: 0.6 mg/dL (ref 0.2–1.2)
Total Protein: 7 g/dL (ref 6.0–8.3)

## 2014-10-11 LAB — CBC WITH DIFFERENTIAL/PLATELET
Basophils Absolute: 0 10*3/uL (ref 0.0–0.1)
Basophils Relative: 0.4 % (ref 0.0–3.0)
Eosinophils Absolute: 0.2 10*3/uL (ref 0.0–0.7)
Eosinophils Relative: 2.3 % (ref 0.0–5.0)
HCT: 36.6 % (ref 36.0–46.0)
Hemoglobin: 13 g/dL (ref 12.0–15.0)
Lymphocytes Relative: 27.1 % (ref 12.0–46.0)
Lymphs Abs: 1.7 10*3/uL (ref 0.7–4.0)
MCHC: 35.6 g/dL (ref 30.0–36.0)
MCV: 90.2 fl (ref 78.0–100.0)
Monocytes Absolute: 0.3 10*3/uL (ref 0.1–1.0)
Monocytes Relative: 5 % (ref 3.0–12.0)
Neutro Abs: 4.2 10*3/uL (ref 1.4–7.7)
Neutrophils Relative %: 65.2 % (ref 43.0–77.0)
Platelets: 316 10*3/uL (ref 150.0–400.0)
RBC: 4.05 Mil/uL (ref 3.87–5.11)
RDW: 13.5 % (ref 11.5–15.5)
WBC: 6.4 10*3/uL (ref 4.0–10.5)

## 2014-10-11 LAB — LIPID PANEL
Cholesterol: 205 mg/dL — ABNORMAL HIGH (ref 0–200)
HDL: 86.8 mg/dL (ref >39.00)
LDL Cholesterol: 104 mg/dL — ABNORMAL HIGH (ref 0–99)
NonHDL: 118.2
Total CHOL/HDL Ratio: 2
Triglycerides: 69 mg/dL (ref 0.0–149.0)
VLDL: 13.8 mg/dL (ref 0.0–40.0)

## 2014-10-11 LAB — BASIC METABOLIC PANEL
BUN: 16 mg/dL (ref 6–23)
CO2: 27 mEq/L (ref 19–32)
Calcium: 9.4 mg/dL (ref 8.4–10.5)
Chloride: 102 mEq/L (ref 96–112)
Creatinine, Ser: 0.86 mg/dL (ref 0.40–1.20)
GFR: 71.68 mL/min (ref >60.00)
Glucose, Bld: 90 mg/dL (ref 70–99)
Potassium: 3.9 mEq/L (ref 3.5–5.1)
Sodium: 135 mEq/L (ref 135–145)

## 2014-10-11 LAB — TSH: TSH: 3.7 u[IU]/mL (ref 0.35–4.50)

## 2014-10-11 MED ORDER — ZOLPIDEM TARTRATE 5 MG PO TABS: 5.0000 mg | ORAL_TABLET | Freq: Every evening | ORAL | Status: DC | PRN

## 2014-10-11 NOTE — Progress Notes (Signed)
Subjective:     Anita Galloway is a 59 y.o. female and is here for a comprehensive physical exam. The patient reports no problems.  History   Social History  . Marital Status: Married    Spouse Name: N/A  . Number of Children: N/A  . Years of Education: N/A   Occupational History  . [employer] -- controller    Social History Main Topics  . Smoking status: Never Smoker   . Smokeless tobacco: Never Used  . Alcohol Use: Yes  . Drug Use: No  . Sexual Activity:    Partners: Male   Other Topics Concern  . Not on file   Social History Narrative   Exercising-- treadmill, walk, elliptical   Health Maintenance  Topic Date Due  . TETANUS/TDAP  09/06/2014  . HIV Screening  10/10/2015 (Originally 05/17/1971)  . INFLUENZA VACCINE  01/07/2015  . PAP SMEAR  02/23/2015  . MAMMOGRAM  03/06/2016  . DEXA SCAN  03/06/2016  . COLONOSCOPY  03/07/2018    The following portions of the patient's history were reviewed and updated as appropriate:  She  has a past medical history of Phlebitis. She  does not have any pertinent problems on file. She  has past surgical history that includes Cesarean section; Mass excision (02/23/2012); and Dilation and curettage of uterus (11/2013). Her family history includes Alzheimer's disease in her mother; Breast cancer in an other family member; Cancer (age of onset: 3572) in her mother; Coronary artery disease in an other family member; Diabetes in her father and another family member; Heart disease in her father; Mental retardation in her sister; Osteoporosis in her sister. She  reports that she has never smoked. She has never used smokeless tobacco. She reports that she drinks alcohol. She reports that she does not use illicit drugs. She has a current medication list which includes the following prescription(s): aspirin, calcium-vitamin d, cholecalciferol, multivitamin, thiamine, and zolpidem. Current Outpatient Prescriptions on File Prior to Visit   Medication Sig Dispense Refill  . aspirin 81 MG tablet Take 81 mg by mouth daily.    . Calcium Carbonate-Vitamin D (CALCIUM-VITAMIN D) 500-200 MG-UNIT per tablet Take 1 tablet by mouth daily.    . cholecalciferol (VITAMIN D) 1000 UNITS tablet Take 1,000 Units by mouth daily.    . Multiple Vitamin (MULTIVITAMIN) tablet Take 1 tablet by mouth daily.    Marland Kitchen. thiamine (VITAMIN B-1) 100 MG tablet Take 100 mg by mouth daily.     No current facility-administered medications on file prior to visit.   She has No Known Allergies..  Review of Systems Review of Systems  Constitutional: Negative for activity change, appetite change and fatigue.  HENT: Negative for hearing loss, congestion, tinnitus and ear discharge.  dentist [id number] Eyes: Negative for visual disturbance (see optho q1y -- vision corrected to 20/20 with glasses).  Respiratory: Negative for cough, chest tightness and shortness of breath.   Cardiovascular: Negative for chest pain, palpitations and leg swelling.  Gastrointestinal: Negative for abdominal pain, diarrhea, constipation and abdominal distention.  Genitourinary: Negative for urgency, frequency, decreased urine volume and difficulty urinating.  Musculoskeletal: Negative for back pain, arthralgias and gait problem.  Skin: Negative for color change, pallor and rash.  Neurological: Negative for dizziness, light-headedness, numbness and headaches.  Hematological: Negative for adenopathy. Does not bruise/bleed easily.  Psychiatric/Behavioral: Negative for suicidal ideas, confusion, sleep disturbance, self-injury, dysphoric mood, decreased concentration and agitation.       Objective:  BP 114/76 mmHg  Pulse  71  Temp(Src) 97.7 F (36.5 C) (Oral)  Ht 5\' 6"  (1.676 m)  Wt 153 lb (69.4 kg)  BMI 24.71 kg/m2  SpO2 98% General appearance: alert, cooperative, appears stated age and no distress Head: Normocephalic, without obvious abnormality, atraumatic Eyes: conjunctivae/corneas  clear. PERRL, EOM's intact. Fundi benign. Ears: normal TM's and external ear canals both ears Nose: Nares normal. Septum midline. Mucosa normal. No drainage or sinus tenderness. Throat: lips, mucosa, and tongue normal; teeth and gums normal Neck: no adenopathy, no carotid bruit, no JVD, supple, symmetrical, trachea midline and thyroid not enlarged, symmetric, no tenderness/mass/nodules Back: symmetric, no curvature. ROM normal. No CVA tenderness. Lungs: clear to auscultation bilaterally Breasts: gyn Heart: deferred Abdomen: soft, non-tender; bowel sounds normal; no masses,  no organomegaly Pelvic: deferred --- gyn Extremities: extremities normal, atraumatic, no cyanosis or edema Pulses: 2+ and symmetric Skin: Skin color, texture, turgor normal. No rashes or lesions Lymph nodes: Cervical, supraclavicular, and axillary nodes normal. Neurologic: Alert and oriented X 3, normal strength and tone. Normal symmetric reflexes. Normal coordination and gait Psych--no depression  Assessment:    Healthy female exam.      Plan:  Check labs   ghm utd See After Visit Summary for Counseling Recommendations    1. Insomnia  - zolpidem (AMBIEN) 5 MG tablet; Take 1 tablet (5 mg total) by mouth at bedtime as needed.  Dispense: 30 tablet; Refill: 5  2. Preventative health care  - Basic metabolic panel - CBC with Differential/Platelet - Hepatic function panel - Lipid panel - POCT urinalysis dipstick - TSH - HIV antibody

## 2014-10-11 NOTE — Progress Notes (Signed)
Pre visit review using our clinic review tool, if applicable. No additional management support is needed unless otherwise documented below in the visit note. 

## 2014-10-11 NOTE — Patient Instructions (Signed)
Preventive Care for Adults A healthy lifestyle and preventive care can promote health and wellness. Preventive health guidelines for women include the following key practices.  A routine yearly physical is a good way to check with your health care provider about your health and preventive screening. It is a chance to share any concerns and updates on your health and to receive a thorough exam.  Visit your dentist for a routine exam and preventive care every 6 months. Brush your teeth twice a day and floss once a day. Good oral hygiene prevents tooth decay and gum disease.  The frequency of eye exams is based on your age, health, family medical history, use of contact lenses, and other factors. Follow your health care provider's recommendations for frequency of eye exams.  Eat a healthy diet. Foods like vegetables, fruits, whole grains, low-fat dairy products, and lean protein foods contain the nutrients you need without too many calories. Decrease your intake of foods high in solid fats, added sugars, and salt. Eat the right amount of calories for you.Get information about a proper diet from your health care provider, if necessary.  Regular physical exercise is one of the most important things you can do for your health. Most adults should get at least 150 minutes of moderate-intensity exercise (any activity that increases your heart rate and causes you to sweat) each week. In addition, most adults need muscle-strengthening exercises on 2 or more days a week.  Maintain a healthy weight. The body mass index (BMI) is a screening tool to identify possible weight problems. It provides an estimate of body fat based on height and weight. Your health care provider can find your BMI and can help you achieve or maintain a healthy weight.For adults 20 years and older:  A BMI below 18.5 is considered underweight.  A BMI of 18.5 to 24.9 is normal.  A BMI of 25 to 29.9 is considered overweight.  A BMI of  30 and above is considered obese.  Maintain normal blood lipids and cholesterol levels by exercising and minimizing your intake of saturated fat. Eat a balanced diet with plenty of fruit and vegetables. Blood tests for lipids and cholesterol should begin at age 76 and be repeated every 5 years. If your lipid or cholesterol levels are high, you are over 50, or you are at high risk for heart disease, you may need your cholesterol levels checked more frequently.Ongoing high lipid and cholesterol levels should be treated with medicines if diet and exercise are not working.  If you smoke, find out from your health care provider how to quit. If you do not use tobacco, do not start.  Lung cancer screening is recommended for adults aged 22-80 years who are at high risk for developing lung cancer because of a history of smoking. A yearly low-dose CT scan of the lungs is recommended for people who have at least a 30-pack-year history of smoking and are a current smoker or have quit within the past 15 years. A pack year of smoking is smoking an average of 1 pack of cigarettes a day for 1 year (for example: 1 pack a day for 30 years or 2 packs a day for 15 years). Yearly screening should continue until the smoker has stopped smoking for at least 15 years. Yearly screening should be stopped for people who develop a health problem that would prevent them from having lung cancer treatment.  If you are pregnant, do not drink alcohol. If you are breastfeeding,  be very cautious about drinking alcohol. If you are not pregnant and choose to drink alcohol, do not have more than 1 drink per day. One drink is considered to be 12 ounces (355 mL) of beer, 5 ounces (148 mL) of wine, or 1.5 ounces (44 mL) of liquor.  Avoid use of street drugs. Do not share needles with anyone. Ask for help if you need support or instructions about stopping the use of drugs.  High blood pressure causes heart disease and increases the risk of  stroke. Your blood pressure should be checked at least every 1 to 2 years. Ongoing high blood pressure should be treated with medicines if weight loss and exercise do not work.  If you are 75-52 years old, ask your health care provider if you should take aspirin to prevent strokes.  Diabetes screening involves taking a blood sample to check your fasting blood sugar level. This should be done once every 3 years, after age 15, if you are within normal weight and without risk factors for diabetes. Testing should be considered at a younger age or be carried out more frequently if you are overweight and have at least 1 risk factor for diabetes.  Breast cancer screening is essential preventive care for women. You should practice "breast self-awareness." This means understanding the normal appearance and feel of your breasts and may include breast self-examination. Any changes detected, no matter how small, should be reported to a health care provider. Women in their 58s and 30s should have a clinical breast exam (CBE) by a health care provider as part of a regular health exam every 1 to 3 years. After age 16, women should have a CBE every year. Starting at age 53, women should consider having a mammogram (breast X-ray test) every year. Women who have a family history of breast cancer should talk to their health care provider about genetic screening. Women at a high risk of breast cancer should talk to their health care providers about having an MRI and a mammogram every year.  Breast cancer gene (BRCA)-related cancer risk assessment is recommended for women who have family members with BRCA-related cancers. BRCA-related cancers include breast, ovarian, tubal, and peritoneal cancers. Having family members with these cancers may be associated with an increased risk for harmful changes (mutations) in the breast cancer genes BRCA1 and BRCA2. Results of the assessment will determine the need for genetic counseling and  BRCA1 and BRCA2 testing.  Routine pelvic exams to screen for cancer are no longer recommended for nonpregnant women who are considered low risk for cancer of the pelvic organs (ovaries, uterus, and vagina) and who do not have symptoms. Ask your health care provider if a screening pelvic exam is right for you.  If you have had past treatment for cervical cancer or a condition that could lead to cancer, you need Pap tests and screening for cancer for at least 20 years after your treatment. If Pap tests have been discontinued, your risk factors (such as having a new sexual partner) need to be reassessed to determine if screening should be resumed. Some women have medical problems that increase the chance of getting cervical cancer. In these cases, your health care provider may recommend more frequent screening and Pap tests.  The HPV test is an additional test that may be used for cervical cancer screening. The HPV test looks for the virus that can cause the cell changes on the cervix. The cells collected during the Pap test can be  tested for HPV. The HPV test could be used to screen women aged 30 years and older, and should be used in women of any age who have unclear Pap test results. After the age of 30, women should have HPV testing at the same frequency as a Pap test.  Colorectal cancer can be detected and often prevented. Most routine colorectal cancer screening begins at the age of 50 years and continues through age 75 years. However, your health care provider may recommend screening at an earlier age if you have risk factors for colon cancer. On a yearly basis, your health care provider may provide home test kits to check for hidden blood in the stool. Use of a small camera at the end of a tube, to directly examine the colon (sigmoidoscopy or colonoscopy), can detect the earliest forms of colorectal cancer. Talk to your health care provider about this at age 50, when routine screening begins. Direct  exam of the colon should be repeated every 5-10 years through age 75 years, unless early forms of pre-cancerous polyps or small growths are found.  People who are at an increased risk for hepatitis B should be screened for this virus. You are considered at high risk for hepatitis B if:  You were born in a country where hepatitis B occurs often. Talk with your health care provider about which countries are considered high risk.  Your parents were born in a high-risk country and you have not received a shot to protect against hepatitis B (hepatitis B vaccine).  You have HIV or AIDS.  You use needles to inject street drugs.  You live with, or have sex with, someone who has hepatitis B.  You get hemodialysis treatment.  You take certain medicines for conditions like cancer, organ transplantation, and autoimmune conditions.  Hepatitis C blood testing is recommended for all people born from 1945 through 1965 and any individual with known risks for hepatitis C.  Practice safe sex. Use condoms and avoid high-risk sexual practices to reduce the spread of sexually transmitted infections (STIs). STIs include gonorrhea, chlamydia, syphilis, trichomonas, herpes, HPV, and human immunodeficiency virus (HIV). Herpes, HIV, and HPV are viral illnesses that have no cure. They can result in disability, cancer, and death.  You should be screened for sexually transmitted illnesses (STIs) including gonorrhea and chlamydia if:  You are sexually active and are younger than 24 years.  You are older than 24 years and your health care provider tells you that you are at risk for this type of infection.  Your sexual activity has changed since you were last screened and you are at an increased risk for chlamydia or gonorrhea. Ask your health care provider if you are at risk.  If you are at risk of being infected with HIV, it is recommended that you take a prescription medicine daily to prevent HIV infection. This is  called preexposure prophylaxis (PrEP). You are considered at risk if:  You are a heterosexual woman, are sexually active, and are at increased risk for HIV infection.  You take drugs by injection.  You are sexually active with a partner who has HIV.  Talk with your health care provider about whether you are at high risk of being infected with HIV. If you choose to begin PrEP, you should first be tested for HIV. You should then be tested every 3 months for as long as you are taking PrEP.  Osteoporosis is a disease in which the bones lose minerals and strength   with aging. This can result in serious bone fractures or breaks. The risk of osteoporosis can be identified using a bone density scan. Women ages 65 years and over and women at risk for fractures or osteoporosis should discuss screening with their health care providers. Ask your health care provider whether you should take a calcium supplement or vitamin D to reduce the rate of osteoporosis.  Menopause can be associated with physical symptoms and risks. Hormone replacement therapy is available to decrease symptoms and risks. You should talk to your health care provider about whether hormone replacement therapy is right for you.  Use sunscreen. Apply sunscreen liberally and repeatedly throughout the day. You should seek shade when your shadow is shorter than you. Protect yourself by wearing long sleeves, pants, a wide-brimmed hat, and sunglasses year round, whenever you are outdoors.  Once a month, do a whole body skin exam, using a mirror to look at the skin on your back. Tell your health care provider of new moles, moles that have irregular borders, moles that are larger than a pencil eraser, or moles that have changed in shape or color.  Stay current with required vaccines (immunizations).  Influenza vaccine. All adults should be immunized every year.  Tetanus, diphtheria, and acellular pertussis (Td, Tdap) vaccine. Pregnant women should  receive 1 dose of Tdap vaccine during each pregnancy. The dose should be obtained regardless of the length of time since the last dose. Immunization is preferred during the 27th-36th week of gestation. An adult who has not previously received Tdap or who does not know her vaccine status should receive 1 dose of Tdap. This initial dose should be followed by tetanus and diphtheria toxoids (Td) booster doses every 10 years. Adults with an unknown or incomplete history of completing a 3-dose immunization series with Td-containing vaccines should begin or complete a primary immunization series including a Tdap dose. Adults should receive a Td booster every 10 years.  Varicella vaccine. An adult without evidence of immunity to varicella should receive 2 doses or a second dose if she has previously received 1 dose. Pregnant females who do not have evidence of immunity should receive the first dose after pregnancy. This first dose should be obtained before leaving the health care facility. The second dose should be obtained 4-8 weeks after the first dose.  Human papillomavirus (HPV) vaccine. Females aged 13-26 years who have not received the vaccine previously should obtain the 3-dose series. The vaccine is not recommended for use in pregnant females. However, pregnancy testing is not needed before receiving a dose. If a female is found to be pregnant after receiving a dose, no treatment is needed. In that case, the remaining doses should be delayed until after the pregnancy. Immunization is recommended for any person with an immunocompromised condition through the age of 26 years if she did not get any or all doses earlier. During the 3-dose series, the second dose should be obtained 4-8 weeks after the first dose. The third dose should be obtained 24 weeks after the first dose and 16 weeks after the second dose.  Zoster vaccine. One dose is recommended for adults aged 60 years or older unless certain conditions are  present.  Measles, mumps, and rubella (MMR) vaccine. Adults born before 1957 generally are considered immune to measles and mumps. Adults born in 1957 or later should have 1 or more doses of MMR vaccine unless there is a contraindication to the vaccine or there is laboratory evidence of immunity to   each of the three diseases. A routine second dose of MMR vaccine should be obtained at least 28 days after the first dose for students attending postsecondary schools, health care workers, or international travelers. People who received inactivated measles vaccine or an unknown type of measles vaccine during 1963-1967 should receive 2 doses of MMR vaccine. People who received inactivated mumps vaccine or an unknown type of mumps vaccine before 1979 and are at high risk for mumps infection should consider immunization with 2 doses of MMR vaccine. For females of childbearing age, rubella immunity should be determined. If there is no evidence of immunity, females who are not pregnant should be vaccinated. If there is no evidence of immunity, females who are pregnant should delay immunization until after pregnancy. Unvaccinated health care workers born before 1957 who lack laboratory evidence of measles, mumps, or rubella immunity or laboratory confirmation of disease should consider measles and mumps immunization with 2 doses of MMR vaccine or rubella immunization with 1 dose of MMR vaccine.  Pneumococcal 13-valent conjugate (PCV13) vaccine. When indicated, a person who is uncertain of her immunization history and has no record of immunization should receive the PCV13 vaccine. An adult aged 19 years or older who has certain medical conditions and has not been previously immunized should receive 1 dose of PCV13 vaccine. This PCV13 should be followed with a dose of pneumococcal polysaccharide (PPSV23) vaccine. The PPSV23 vaccine dose should be obtained at least 8 weeks after the dose of PCV13 vaccine. An adult aged 19  years or older who has certain medical conditions and previously received 1 or more doses of PPSV23 vaccine should receive 1 dose of PCV13. The PCV13 vaccine dose should be obtained 1 or more years after the last PPSV23 vaccine dose.  Pneumococcal polysaccharide (PPSV23) vaccine. When PCV13 is also indicated, PCV13 should be obtained first. All adults aged 65 years and older should be immunized. An adult younger than age 65 years who has certain medical conditions should be immunized. Any person who resides in a nursing home or long-term care facility should be immunized. An adult smoker should be immunized. People with an immunocompromised condition and certain other conditions should receive both PCV13 and PPSV23 vaccines. People with human immunodeficiency virus (HIV) infection should be immunized as soon as possible after diagnosis. Immunization during chemotherapy or radiation therapy should be avoided. Routine use of PPSV23 vaccine is not recommended for American Indians, [place] Natives, or people younger than 65 years unless there are medical conditions that require PPSV23 vaccine. When indicated, people who have unknown immunization and have no record of immunization should receive PPSV23 vaccine. One-time revaccination 5 years after the first dose of PPSV23 is recommended for people aged 19-64 years who have chronic kidney failure, nephrotic syndrome, asplenia, or immunocompromised conditions. People who received 1-2 doses of PPSV23 before age 65 years should receive another dose of PPSV23 vaccine at age 65 years or later if at least 5 years have passed since the previous dose. Doses of PPSV23 are not needed for people immunized with PPSV23 at or after age 65 years.  Meningococcal vaccine. Adults with asplenia or persistent complement component deficiencies should receive 2 doses of quadrivalent meningococcal conjugate (MenACWY-D) vaccine. The doses should be obtained at least 2 months apart.  Microbiologists working with certain meningococcal bacteria, military recruits, people at risk during an outbreak, and people who travel to or live in countries with a high rate of meningitis should be immunized. A first-year college student up through age   21 years who is living in a residence hall should receive a dose if she did not receive a dose on or after her 16th birthday. Adults who have certain high-risk conditions should receive one or more doses of vaccine.  Hepatitis A vaccine. Adults who wish to be protected from this disease, have certain high-risk conditions, work with hepatitis A-infected animals, work in hepatitis A research labs, or travel to or work in countries with a high rate of hepatitis A should be immunized. Adults who were previously unvaccinated and who anticipate close contact with an international adoptee during the first 60 days after arrival in the [place] from a country with a high rate of hepatitis A should be immunized.  Hepatitis B vaccine. Adults who wish to be protected from this disease, have certain high-risk conditions, may be exposed to blood or other infectious body fluids, are household contacts or sex partners of hepatitis B positive people, are clients or workers in certain care facilities, or travel to or work in countries with a high rate of hepatitis B should be immunized.  Haemophilus influenzae type b (Hib) vaccine. A previously unvaccinated person with asplenia or sickle cell disease or having a scheduled splenectomy should receive 1 dose of Hib vaccine. Regardless of previous immunization, a recipient of a hematopoietic stem cell transplant should receive a 3-dose series 6-12 months after her successful transplant. Hib vaccine is not recommended for adults with HIV infection. Preventive Services / Frequency Ages 64 to 68 years  Blood pressure check.** / Every 1 to 2 years.  Lipid and cholesterol check.** / Every 5 years beginning at age  22.  Clinical breast exam.** / Every 3 years for women in their 88s and 53s.  BRCA-related cancer risk assessment.** / For women who have family members with a BRCA-related cancer (breast, ovarian, tubal, or peritoneal cancers).  Pap test.** / Every 2 years from ages 90 through 51. Every 3 years starting at age 21 through age 56 or 3 with a history of 3 consecutive normal Pap tests.  HPV screening.** / Every 3 years from ages 24 through ages 1 to 46 with a history of 3 consecutive normal Pap tests.  Hepatitis C blood test.** / For any individual with known risks for hepatitis C.  Skin self-exam. / Monthly.  Influenza vaccine. / Every year.  Tetanus, diphtheria, and acellular pertussis (Tdap, Td) vaccine.** / Consult your health care provider. Pregnant women should receive 1 dose of Tdap vaccine during each pregnancy. 1 dose of Td every 10 years.  Varicella vaccine.** / Consult your health care provider. Pregnant females who do not have evidence of immunity should receive the first dose after pregnancy.  HPV vaccine. / 3 doses over 6 months, if 72 and younger. The vaccine is not recommended for use in pregnant females. However, pregnancy testing is not needed before receiving a dose.  Measles, mumps, rubella (MMR) vaccine.** / You need at least 1 dose of MMR if you were born in 1957 or later. You may also need a 2nd dose. For females of childbearing age, rubella immunity should be determined. If there is no evidence of immunity, females who are not pregnant should be vaccinated. If there is no evidence of immunity, females who are pregnant should delay immunization until after pregnancy.  Pneumococcal 13-valent conjugate (PCV13) vaccine.** / Consult your health care provider.  Pneumococcal polysaccharide (PPSV23) vaccine.** / 1 to 2 doses if you smoke cigarettes or if you have certain conditions.  Meningococcal vaccine.** /  1 dose if you are age 19 to 21 years and a first-year college  student living in a residence hall, or have one of several medical conditions, you need to get vaccinated against meningococcal disease. You may also need additional booster doses.  Hepatitis A vaccine.** / Consult your health care provider.  Hepatitis B vaccine.** / Consult your health care provider.  Haemophilus influenzae type b (Hib) vaccine.** / Consult your health care provider. Ages 40 to 64 years  Blood pressure check.** / Every 1 to 2 years.  Lipid and cholesterol check.** / Every 5 years beginning at age 20 years.  Lung cancer screening. / Every year if you are aged 55-80 years and have a 30-pack-year history of smoking and currently smoke or have quit within the past 15 years. Yearly screening is stopped once you have quit smoking for at least 15 years or develop a health problem that would prevent you from having lung cancer treatment.  Clinical breast exam.** / Every year after age 40 years.  BRCA-related cancer risk assessment.** / For women who have family members with a BRCA-related cancer (breast, ovarian, tubal, or peritoneal cancers).  Mammogram.** / Every year beginning at age 40 years and continuing for as long as you are in good health. Consult with your health care provider.  Pap test.** / Every 3 years starting at age 30 years through age 65 or 70 years with a history of 3 consecutive normal Pap tests.  HPV screening.** / Every 3 years from ages 30 years through ages 65 to 70 years with a history of 3 consecutive normal Pap tests.  Fecal occult blood test (FOBT) of stool. / Every year beginning at age 50 years and continuing until age 75 years. You may not need to do this test if you get a colonoscopy every 10 years.  Flexible sigmoidoscopy or colonoscopy.** / Every 5 years for a flexible sigmoidoscopy or every 10 years for a colonoscopy beginning at age 50 years and continuing until age 75 years.  Hepatitis C blood test.** / For all people born from 1945 through  1965 and any individual with known risks for hepatitis C.  Skin self-exam. / Monthly.  Influenza vaccine. / Every year.  Tetanus, diphtheria, and acellular pertussis (Tdap/Td) vaccine.** / Consult your health care provider. Pregnant women should receive 1 dose of Tdap vaccine during each pregnancy. 1 dose of Td every 10 years.  Varicella vaccine.** / Consult your health care provider. Pregnant females who do not have evidence of immunity should receive the first dose after pregnancy.  Zoster vaccine.** / 1 dose for adults aged 60 years or older.  Measles, mumps, rubella (MMR) vaccine.** / You need at least 1 dose of MMR if you were born in 1957 or later. You may also need a 2nd dose. For females of childbearing age, rubella immunity should be determined. If there is no evidence of immunity, females who are not pregnant should be vaccinated. If there is no evidence of immunity, females who are pregnant should delay immunization until after pregnancy.  Pneumococcal 13-valent conjugate (PCV13) vaccine.** / Consult your health care provider.  Pneumococcal polysaccharide (PPSV23) vaccine.** / 1 to 2 doses if you smoke cigarettes or if you have certain conditions.  Meningococcal vaccine.** / Consult your health care provider.  Hepatitis A vaccine.** / Consult your health care provider.  Hepatitis B vaccine.** / Consult your health care provider.  Haemophilus influenzae type b (Hib) vaccine.** / Consult your health care provider. Ages 65   years and over  Blood pressure check.** / Every 1 to 2 years.  Lipid and cholesterol check.** / Every 5 years beginning at age 22 years.  Lung cancer screening. / Every year if you are aged 73-80 years and have a 30-pack-year history of smoking and currently smoke or have quit within the past 15 years. Yearly screening is stopped once you have quit smoking for at least 15 years or develop a health problem that would prevent you from having lung cancer  treatment.  Clinical breast exam.** / Every year after age 4 years.  BRCA-related cancer risk assessment.** / For women who have family members with a BRCA-related cancer (breast, ovarian, tubal, or peritoneal cancers).  Mammogram.** / Every year beginning at age 40 years and continuing for as long as you are in good health. Consult with your health care provider.  Pap test.** / Every 3 years starting at age 9 years through age 34 or 91 years with 3 consecutive normal Pap tests. Testing can be stopped between 65 and 70 years with 3 consecutive normal Pap tests and no abnormal Pap or HPV tests in the past 10 years.  HPV screening.** / Every 3 years from ages 57 years through ages 64 or 45 years with a history of 3 consecutive normal Pap tests. Testing can be stopped between 65 and 70 years with 3 consecutive normal Pap tests and no abnormal Pap or HPV tests in the past 10 years.  Fecal occult blood test (FOBT) of stool. / Every year beginning at age 15 years and continuing until age 17 years. You may not need to do this test if you get a colonoscopy every 10 years.  Flexible sigmoidoscopy or colonoscopy.** / Every 5 years for a flexible sigmoidoscopy or every 10 years for a colonoscopy beginning at age 86 years and continuing until age 71 years.  Hepatitis C blood test.** / For all people born from [birth year] through 1966 and any individual with known risks for hepatitis C.  Osteoporosis screening.** / A one-time screening for women ages 83 years and over and women at risk for fractures or osteoporosis.  Skin self-exam. / Monthly.  Influenza vaccine. / Every year.  Tetanus, diphtheria, and acellular pertussis (Tdap/Td) vaccine.** / 1 dose of Td every 10 years.  Varicella vaccine.** / Consult your health care provider.  Zoster vaccine.** / 1 dose for adults aged 61 years or older.  Pneumococcal 13-valent conjugate (PCV13) vaccine.** / Consult your health care provider.  Pneumococcal  polysaccharide (PPSV23) vaccine.** / 1 dose for all adults aged 28 years and older.  Meningococcal vaccine.** / Consult your health care provider.  Hepatitis A vaccine.** / Consult your health care provider.  Hepatitis B vaccine.** / Consult your health care provider.  Haemophilus influenzae type b (Hib) vaccine.** / Consult your health care provider. ** Family history and personal history of risk and conditions may change your health care provider's recommendations. Document Released: 07/21/2001 Document Revised: 10/09/2013 Document Reviewed: 10/20/2010 [facility] Patient Information 2015 [place], [place]. This information is not intended to replace advice given to you by your health care provider. Make sure you discuss any questions you have with your health care provider.

## 2014-10-12 LAB — HIV ANTIBODY (ROUTINE TESTING W REFLEX): HIV 1&2 Ab, 4th Generation: NONREACTIVE

## 2014-10-12 NOTE — Addendum Note (Signed)
Addended by: Arnette NorrisPAYNE, [place] P on: 10/12/2014 10:04 AM   Modules accepted: Orders

## 2014-10-16 ENCOUNTER — Encounter: Payer: Self-pay | Admitting: Family Medicine

## 2015-02-12 ENCOUNTER — Other Ambulatory Visit: Payer: Self-pay

## 2015-02-12 DIAGNOSIS — Z1231 Encounter for screening mammogram for malignant neoplasm of breast: Secondary | ICD-10-CM

## 2015-03-12 ENCOUNTER — Ambulatory Visit
Admission: RE | Admit: 2015-03-12 | Discharge: 2015-03-12 | Disposition: A | Discharge status: Home / Self Care | Payer: BLUE CROSS/BLUE SHIELD | Source: Ambulatory Visit

## 2015-03-12 DIAGNOSIS — Z1231 Encounter for screening mammogram for malignant neoplasm of breast: Secondary | ICD-10-CM

## 2015-03-26 ENCOUNTER — Telehealth: Payer: Self-pay | Admitting: Family Medicine

## 2015-03-26 NOTE — Telephone Encounter (Signed)
Caller name: Shirlee LimerickMarion Can be reached: [phone]  Reason for call: [facility] [place], per pt there is a coding issue. It was a wellness visit but [employer] told pt the labs were not coded for a wellness visit.

## 2015-04-03 NOTE — Telephone Encounter (Signed)
[facility] with [facility] at [facility], this is being re-filed using DX Z11.4. Patient is aware and will wait for claim to be reprocessed by her insurance.

## 2015-06-27 ENCOUNTER — Other Ambulatory Visit: Payer: Self-pay | Admitting: Family Medicine

## 2015-06-27 NOTE — Telephone Encounter (Signed)
Last seen and filled 10/11/14 #30 with 5 refills.  Please advise     KP

## 2015-07-02 ENCOUNTER — Other Ambulatory Visit: Payer: Self-pay | Admitting: Family Medicine

## 2015-10-14 ENCOUNTER — Telehealth: Payer: Self-pay | Admitting: *Deleted

## 2015-10-14 NOTE — Telephone Encounter (Signed)
Unable to reach patient at time of pre-visit call. Left message for patient to return call when available.  

## 2015-10-15 ENCOUNTER — Ambulatory Visit (INDEPENDENT_AMBULATORY_CARE_PROVIDER_SITE_OTHER): Payer: BLUE CROSS/BLUE SHIELD | Admitting: Family Medicine

## 2015-10-15 ENCOUNTER — Encounter: Payer: Self-pay | Admitting: Family Medicine

## 2015-10-15 VITALS — BP 113/80 | HR 64 | Temp 98.1°F | Ht 66.0 in | Wt 153.6 lb

## 2015-10-15 DIAGNOSIS — Z23 Encounter for immunization: Secondary | ICD-10-CM

## 2015-10-15 DIAGNOSIS — Z1159 Encounter for screening for other viral diseases: Secondary | ICD-10-CM | POA: Diagnosis not present

## 2015-10-15 DIAGNOSIS — Z0000 Encounter for general adult medical examination without abnormal findings: Secondary | ICD-10-CM

## 2015-10-15 LAB — CBC WITH DIFFERENTIAL/PLATELET
Basophils Absolute: 0 10*3/uL (ref 0.0–0.1)
Basophils Relative: 0.4 % (ref 0.0–3.0)
Eosinophils Absolute: 0.3 10*3/uL (ref 0.0–0.7)
Eosinophils Relative: 4.1 % (ref 0.0–5.0)
HCT: 38.2 % (ref 36.0–46.0)
Hemoglobin: 12.8 g/dL (ref 12.0–15.0)
Lymphocytes Relative: 24.2 % (ref 12.0–46.0)
Lymphs Abs: 1.7 10*3/uL (ref 0.7–4.0)
MCHC: 33.4 g/dL (ref 30.0–36.0)
MCV: 93.6 fl (ref 78.0–100.0)
Monocytes Absolute: 0.3 10*3/uL (ref 0.1–1.0)
Monocytes Relative: 4.8 % (ref 3.0–12.0)
Neutro Abs: 4.8 10*3/uL (ref 1.4–7.7)
Neutrophils Relative %: 66.5 % (ref 43.0–77.0)
Platelets: 343 10*3/uL (ref 150.0–400.0)
RBC: 4.08 Mil/uL (ref 3.87–5.11)
RDW: 13.5 % (ref 11.5–15.5)
WBC: 7.2 10*3/uL (ref 4.0–10.5)

## 2015-10-15 LAB — POCT URINALYSIS DIPSTICK
Bilirubin, UA: NEGATIVE
Blood, UA: NEGATIVE
Glucose, UA: NEGATIVE
Ketones, UA: NEGATIVE
Leukocytes, UA: NEGATIVE
Nitrite, UA: NEGATIVE
Protein, UA: NEGATIVE
Spec Grav, UA: >=1.03
Urobilinogen, UA: 0.2
pH, UA: 5.5

## 2015-10-15 LAB — TSH: TSH: 3.17 u[IU]/mL (ref 0.35–4.50)

## 2015-10-15 LAB — LIPID PANEL
Cholesterol: 214 mg/dL — ABNORMAL HIGH (ref 0–200)
HDL: 84.8 mg/dL (ref >39.00)
LDL Cholesterol: 113 mg/dL — ABNORMAL HIGH (ref 0–99)
NonHDL: 129.2
Total CHOL/HDL Ratio: 3
Triglycerides: 82 mg/dL (ref 0.0–149.0)
VLDL: 16.4 mg/dL (ref 0.0–40.0)

## 2015-10-15 LAB — COMPREHENSIVE METABOLIC PANEL
ALT: 20 U/L (ref 0–35)
AST: 23 U/L (ref 0–37)
Albumin: 4.5 g/dL (ref 3.5–5.2)
Alkaline Phosphatase: 66 U/L (ref 39–117)
BUN: 17 mg/dL (ref 6–23)
CO2: 26 mEq/L (ref 19–32)
Calcium: 9.4 mg/dL (ref 8.4–10.5)
Chloride: 105 mEq/L (ref 96–112)
Creatinine, Ser: 0.85 mg/dL (ref 0.40–1.20)
GFR: 72.41 mL/min (ref >60.00)
Glucose, Bld: 91 mg/dL (ref 70–99)
Potassium: 3.9 mEq/L (ref 3.5–5.1)
Sodium: 140 mEq/L (ref 135–145)
Total Bilirubin: 0.5 mg/dL (ref 0.2–1.2)
Total Protein: 6.9 g/dL (ref 6.0–8.3)

## 2015-10-15 NOTE — Patient Instructions (Signed)
Preventive Care for Adults, Female A healthy lifestyle and preventive care can promote health and wellness. Preventive health guidelines for women include the following key practices.  A routine yearly physical is a good way to check with your health care provider about your health and preventive screening. It is a chance to share any concerns and updates on your health and to receive a thorough exam.  Visit your dentist for a routine exam and preventive care every 6 months. Brush your teeth twice a day and floss once a day. Good oral hygiene prevents tooth decay and gum disease.  The frequency of eye exams is based on your age, health, family medical history, use of contact lenses, and other factors. Follow your health care provider's recommendations for frequency of eye exams.  Eat a healthy diet. Foods like vegetables, fruits, whole grains, low-fat dairy products, and lean protein foods contain the nutrients you need without too many calories. Decrease your intake of foods high in solid fats, added sugars, and salt. Eat the right amount of calories for you.Get information about a proper diet from your health care provider, if necessary.  Regular physical exercise is one of the most important things you can do for your health. Most adults should get at least 150 minutes of moderate-intensity exercise (any activity that increases your heart rate and causes you to sweat) each week. In addition, most adults need muscle-strengthening exercises on 2 or more days a week.  Maintain a healthy weight. The body mass index (BMI) is a screening tool to identify possible weight problems. It provides an estimate of body fat based on height and weight. Your health care provider can find your BMI and can help you achieve or maintain a healthy weight.For adults 20 years and older:  A BMI below 18.5 is considered underweight.  A BMI of 18.5 to 24.9 is normal.  A BMI of 25 to 29.9 is considered overweight.  A  BMI of 30 and above is considered obese.  Maintain normal blood lipids and cholesterol levels by exercising and minimizing your intake of saturated fat. Eat a balanced diet with plenty of fruit and vegetables. Blood tests for lipids and cholesterol should begin at age 45 and be repeated every 5 years. If your lipid or cholesterol levels are high, you are over 50, or you are at high risk for heart disease, you may need your cholesterol levels checked more frequently.Ongoing high lipid and cholesterol levels should be treated with medicines if diet and exercise are not working.  If you smoke, find out from your health care provider how to quit. If you do not use tobacco, do not start.  Lung cancer screening is recommended for adults aged 45-80 years who are at high risk for developing lung cancer because of a history of smoking. A yearly low-dose CT scan of the lungs is recommended for people who have at least a 30-pack-year history of smoking and are a current smoker or have quit within the past 15 years. A pack year of smoking is smoking an average of 1 pack of cigarettes a day for 1 year (for example: 1 pack a day for 30 years or 2 packs a day for 15 years). Yearly screening should continue until the smoker has stopped smoking for at least 15 years. Yearly screening should be stopped for people who develop a health problem that would prevent them from having lung cancer treatment.  If you are pregnant, do not drink alcohol. If you are  breastfeeding, be very cautious about drinking alcohol. If you are not pregnant and choose to drink alcohol, do not have more than 1 drink per day. One drink is considered to be 12 ounces (355 mL) of beer, 5 ounces (148 mL) of wine, or 1.5 ounces (44 mL) of liquor.  Avoid use of street drugs. Do not share needles with anyone. Ask for help if you need support or instructions about stopping the use of drugs.  High blood pressure causes heart disease and increases the risk  of stroke. Your blood pressure should be checked at least every 1 to 2 years. Ongoing high blood pressure should be treated with medicines if weight loss and exercise do not work.  If you are 55-79 years old, ask your health care provider if you should take aspirin to prevent strokes.  Diabetes screening is done by taking a blood sample to check your blood glucose level after you have not eaten for a certain period of time (fasting). If you are not overweight and you do not have risk factors for diabetes, you should be screened once every 3 years starting at age 45. If you are overweight or obese and you are 40-70 years of age, you should be screened for diabetes every year as part of your cardiovascular risk assessment.  Breast cancer screening is essential preventive care for women. You should practice "breast self-awareness." This means understanding the normal appearance and feel of your breasts and may include breast self-examination. Any changes detected, no matter how small, should be reported to a health care provider. Women in their 20s and 30s should have a clinical breast exam (CBE) by a health care provider as part of a regular health exam every 1 to 3 years. After age 40, women should have a CBE every year. Starting at age 40, women should consider having a mammogram (breast X-ray test) every year. Women who have a family history of breast cancer should talk to their health care provider about genetic screening. Women at a high risk of breast cancer should talk to their health care providers about having an MRI and a mammogram every year.  Breast cancer gene (BRCA)-related cancer risk assessment is recommended for women who have family members with BRCA-related cancers. BRCA-related cancers include breast, ovarian, tubal, and peritoneal cancers. Having family members with these cancers may be associated with an increased risk for harmful changes (mutations) in the breast cancer genes BRCA1 and  BRCA2. Results of the assessment will determine the need for genetic counseling and BRCA1 and BRCA2 testing.  Your health care provider may recommend that you be screened regularly for cancer of the pelvic organs (ovaries, uterus, and vagina). This screening involves a pelvic examination, including checking for microscopic changes to the surface of your cervix (Pap test). You may be encouraged to have this screening done every 3 years, beginning at age 21.  For women ages 30-65, health care providers may recommend pelvic exams and Pap testing every 3 years, or they may recommend the Pap and pelvic exam, combined with testing for human papilloma virus (HPV), every 5 years. Some types of HPV increase your risk of cervical cancer. Testing for HPV may also be done on women of any age with unclear Pap test results.  Other health care providers may not recommend any screening for nonpregnant women who are considered low risk for pelvic cancer and who do not have symptoms. Ask your health care provider if a screening pelvic exam is right for   you.  If you have had past treatment for cervical cancer or a condition that could lead to cancer, you need Pap tests and screening for cancer for at least 20 years after your treatment. If Pap tests have been discontinued, your risk factors (such as having a new sexual partner) need to be reassessed to determine if screening should resume. Some women have medical problems that increase the chance of getting cervical cancer. In these cases, your health care provider may recommend more frequent screening and Pap tests.  Colorectal cancer can be detected and often prevented. Most routine colorectal cancer screening begins at the age of 50 years and continues through age 75 years. However, your health care provider may recommend screening at an earlier age if you have risk factors for colon cancer. On a yearly basis, your health care provider may provide home test kits to check  for hidden blood in the stool. Use of a small camera at the end of a tube, to directly examine the colon (sigmoidoscopy or colonoscopy), can detect the earliest forms of colorectal cancer. Talk to your health care provider about this at age 50, when routine screening begins. Direct exam of the colon should be repeated every 5-10 years through age 75 years, unless early forms of precancerous polyps or small growths are found.  People who are at an increased risk for hepatitis B should be screened for this virus. You are considered at high risk for hepatitis B if:  You were born in a country where hepatitis B occurs often. Talk with your health care provider about which countries are considered high risk.  Your parents were born in a high-risk country and you have not received a shot to protect against hepatitis B (hepatitis B vaccine).  You have HIV or AIDS.  You use needles to inject street drugs.  You live with, or have sex with, someone who has hepatitis B.  You get hemodialysis treatment.  You take certain medicines for conditions like cancer, organ transplantation, and autoimmune conditions.  Hepatitis C blood testing is recommended for all people born from 1945 through 1965 and any individual with known risks for hepatitis C.  Practice safe sex. Use condoms and avoid high-risk sexual practices to reduce the spread of sexually transmitted infections (STIs). STIs include gonorrhea, chlamydia, syphilis, trichomonas, herpes, HPV, and human immunodeficiency virus (HIV). Herpes, HIV, and HPV are viral illnesses that have no cure. They can result in disability, cancer, and death.  You should be screened for sexually transmitted illnesses (STIs) including gonorrhea and chlamydia if:  You are sexually active and are younger than 24 years.  You are older than 24 years and your health care provider tells you that you are at risk for this type of infection.  Your sexual activity has changed  since you were last screened and you are at an increased risk for chlamydia or gonorrhea. Ask your health care provider if you are at risk.  If you are at risk of being infected with HIV, it is recommended that you take a prescription medicine daily to prevent HIV infection. This is called preexposure prophylaxis (PrEP). You are considered at risk if:  You are sexually active and do not regularly use condoms or know the HIV status of your partner(s).  You take drugs by injection.  You are sexually active with a partner who has HIV.  Talk with your health care provider about whether you are at high risk of being infected with HIV. If   you choose to begin PrEP, you should first be tested for HIV. You should then be tested every 3 months for as long as you are taking PrEP.  Osteoporosis is a disease in which the bones lose minerals and strength with aging. This can result in serious bone fractures or breaks. The risk of osteoporosis can be identified using a bone density scan. Women ages 67 years and over and women at risk for fractures or osteoporosis should discuss screening with their health care providers. Ask your health care provider whether you should take a calcium supplement or vitamin D to reduce the rate of osteoporosis.  Menopause can be associated with physical symptoms and risks. Hormone replacement therapy is available to decrease symptoms and risks. You should talk to your health care provider about whether hormone replacement therapy is right for you.  Use sunscreen. Apply sunscreen liberally and repeatedly throughout the day. You should seek shade when your shadow is shorter than you. Protect yourself by wearing long sleeves, pants, a wide-brimmed hat, and sunglasses year round, whenever you are outdoors.  Once a month, do a whole body skin exam, using a mirror to look at the skin on your back. Tell your health care provider of new moles, moles that have irregular borders, moles that  are larger than a pencil eraser, or moles that have changed in shape or color.  Stay current with required vaccines (immunizations).  Influenza vaccine. All adults should be immunized every year.  Tetanus, diphtheria, and acellular pertussis (Td, Tdap) vaccine. Pregnant women should receive 1 dose of Tdap vaccine during each pregnancy. The dose should be obtained regardless of the length of time since the last dose. Immunization is preferred during the 27th-36th week of gestation. An adult who has not previously received Tdap or who does not know her vaccine status should receive 1 dose of Tdap. This initial dose should be followed by tetanus and diphtheria toxoids (Td) booster doses every 10 years. Adults with an unknown or incomplete history of completing a 3-dose immunization series with Td-containing vaccines should begin or complete a primary immunization series including a Tdap dose. Adults should receive a Td booster every 10 years.  Varicella vaccine. An adult without evidence of immunity to varicella should receive 2 doses or a second dose if she has previously received 1 dose. Pregnant females who do not have evidence of immunity should receive the first dose after pregnancy. This first dose should be obtained before leaving the health care facility. The second dose should be obtained 4-8 weeks after the first dose.  Human papillomavirus (HPV) vaccine. Females aged 13-26 years who have not received the vaccine previously should obtain the 3-dose series. The vaccine is not recommended for use in pregnant females. However, pregnancy testing is not needed before receiving a dose. If a female is found to be pregnant after receiving a dose, no treatment is needed. In that case, the remaining doses should be delayed until after the pregnancy. Immunization is recommended for any person with an immunocompromised condition through the age of 61 years if she did not get any or all doses earlier. During the  3-dose series, the second dose should be obtained 4-8 weeks after the first dose. The third dose should be obtained 24 weeks after the first dose and 16 weeks after the second dose.  Zoster vaccine. One dose is recommended for adults aged 30 years or older unless certain conditions are present.  Measles, mumps, and rubella (MMR) vaccine. Adults born  before 1957 generally are considered immune to measles and mumps. Adults born in 1957 or later should have 1 or more doses of MMR vaccine unless there is a contraindication to the vaccine or there is laboratory evidence of immunity to each of the three diseases. A routine second dose of MMR vaccine should be obtained at least 28 days after the first dose for students attending postsecondary schools, health care workers, or international travelers. People who received inactivated measles vaccine or an unknown type of measles vaccine during 1963-1967 should receive 2 doses of MMR vaccine. People who received inactivated mumps vaccine or an unknown type of mumps vaccine before 1979 and are at high risk for mumps infection should consider immunization with 2 doses of MMR vaccine. For females of childbearing age, rubella immunity should be determined. If there is no evidence of immunity, females who are not pregnant should be vaccinated. If there is no evidence of immunity, females who are pregnant should delay immunization until after pregnancy. Unvaccinated health care workers born before 1957 who lack laboratory evidence of measles, mumps, or rubella immunity or laboratory confirmation of disease should consider measles and mumps immunization with 2 doses of MMR vaccine or rubella immunization with 1 dose of MMR vaccine.  Pneumococcal 13-valent conjugate (PCV13) vaccine. When indicated, a person who is uncertain of his immunization history and has no record of immunization should receive the PCV13 vaccine. All adults 65 years of age and older should receive this  vaccine. An adult aged 19 years or older who has certain medical conditions and has not been previously immunized should receive 1 dose of PCV13 vaccine. This PCV13 should be followed with a dose of pneumococcal polysaccharide (PPSV23) vaccine. Adults who are at high risk for pneumococcal disease should obtain the PPSV23 vaccine at least 8 weeks after the dose of PCV13 vaccine. Adults older than 61 years of age who have normal immune system function should obtain the PPSV23 vaccine dose at least 1 year after the dose of PCV13 vaccine.  Pneumococcal polysaccharide (PPSV23) vaccine. When PCV13 is also indicated, PCV13 should be obtained first. All adults aged 65 years and older should be immunized. An adult younger than age 65 years who has certain medical conditions should be immunized. Any person who resides in a nursing home or long-term care facility should be immunized. An adult smoker should be immunized. People with an immunocompromised condition and certain other conditions should receive both PCV13 and PPSV23 vaccines. People with human immunodeficiency virus (HIV) infection should be immunized as soon as possible after diagnosis. Immunization during chemotherapy or radiation therapy should be avoided. Routine use of PPSV23 vaccine is not recommended for American Indians, [place] Natives, or people younger than 65 years unless there are medical conditions that require PPSV23 vaccine. When indicated, people who have unknown immunization and have no record of immunization should receive PPSV23 vaccine. One-time revaccination 5 years after the first dose of PPSV23 is recommended for people aged 19-64 years who have chronic kidney failure, nephrotic syndrome, asplenia, or immunocompromised conditions. People who received 1-2 doses of PPSV23 before age 65 years should receive another dose of PPSV23 vaccine at age 65 years or later if at least 5 years have passed since the previous dose. Doses of PPSV23 are not  needed for people immunized with PPSV23 at or after age 65 years.  Meningococcal vaccine. Adults with asplenia or persistent complement component deficiencies should receive 2 doses of quadrivalent meningococcal conjugate (MenACWY-D) vaccine. The doses should be obtained   at least 2 months apart. Microbiologists working with certain meningococcal bacteria, [place] recruits, people at risk during an outbreak, and people who travel to or live in countries with a high rate of meningitis should be immunized. A first-year college student up through age 34 years who is living in a residence hall should receive a dose if she did not receive a dose on or after her 16th birthday. Adults who have certain high-risk conditions should receive one or more doses of vaccine.  Hepatitis A vaccine. Adults who wish to be protected from this disease, have certain high-risk conditions, work with hepatitis A-infected animals, work in hepatitis A research labs, or travel to or work in countries with a high rate of hepatitis A should be immunized. Adults who were previously unvaccinated and who anticipate close contact with an international adoptee during the first 60 days after arrival in the [place] from a country with a high rate of hepatitis A should be immunized.  Hepatitis B vaccine. Adults who wish to be protected from this disease, have certain high-risk conditions, may be exposed to blood or other infectious body fluids, are household contacts or sex partners of hepatitis B positive people, are clients or workers in certain care facilities, or travel to or work in countries with a high rate of hepatitis B should be immunized.  Haemophilus influenzae type b (Hib) vaccine. A previously unvaccinated person with asplenia or sickle cell disease or having a scheduled splenectomy should receive 1 dose of Hib vaccine. Regardless of previous immunization, a recipient of a hematopoietic stem cell transplant should receive a  3-dose series 6-12 months after her successful transplant. Hib vaccine is not recommended for adults with HIV infection. Preventive Services / Frequency Ages 35 to 4 years  Blood pressure check.** / Every 3-5 years.  Lipid and cholesterol check.** / Every 5 years beginning at age 60.  Clinical breast exam.** / Every 3 years for women in their 71s and 10s.  BRCA-related cancer risk assessment.** / For women who have family members with a BRCA-related cancer (breast, ovarian, tubal, or peritoneal cancers).  Pap test.** / Every 2 years from ages 76 through 26. Every 3 years starting at age 61 through age 76 or 93 with a history of 3 consecutive normal Pap tests.  HPV screening.** / Every 3 years from ages 37 through ages 60 to 51 with a history of 3 consecutive normal Pap tests.  Hepatitis C blood test.** / For any individual with known risks for hepatitis C.  Skin self-exam. / Monthly.  Influenza vaccine. / Every year.  Tetanus, diphtheria, and acellular pertussis (Tdap, Td) vaccine.** / Consult your health care provider. Pregnant women should receive 1 dose of Tdap vaccine during each pregnancy. 1 dose of Td every 10 years.  Varicella vaccine.** / Consult your health care provider. Pregnant females who do not have evidence of immunity should receive the first dose after pregnancy.  HPV vaccine. / 3 doses over 6 months, if 93 and younger. The vaccine is not recommended for use in pregnant females. However, pregnancy testing is not needed before receiving a dose.  Measles, mumps, rubella (MMR) vaccine.** / You need at least 1 dose of MMR if you were born in 1957 or later. You may also need a 2nd dose. For females of childbearing age, rubella immunity should be determined. If there is no evidence of immunity, females who are not pregnant should be vaccinated. If there is no evidence of immunity, females who are  pregnant should delay immunization until after pregnancy.  Pneumococcal  13-valent conjugate (PCV13) vaccine.** / Consult your health care provider.  Pneumococcal polysaccharide (PPSV23) vaccine.** / 1 to 2 doses if you smoke cigarettes or if you have certain conditions.  Meningococcal vaccine.** / 1 dose if you are age 68 to 8 years and a Market researcher living in a residence hall, or have one of several medical conditions, you need to get vaccinated against meningococcal disease. You may also need additional booster doses.  Hepatitis A vaccine.** / Consult your health care provider.  Hepatitis B vaccine.** / Consult your health care provider.  Haemophilus influenzae type b (Hib) vaccine.** / Consult your health care provider. Ages 7 to 53 years  Blood pressure check.** / Every year.  Lipid and cholesterol check.** / Every 5 years beginning at age 25 years.  Lung cancer screening. / Every year if you are aged 11-80 years and have a 30-pack-year history of smoking and currently smoke or have quit within the past 15 years. Yearly screening is stopped once you have quit smoking for at least 15 years or develop a health problem that would prevent you from having lung cancer treatment.  Clinical breast exam.** / Every year after age 48 years.  BRCA-related cancer risk assessment.** / For women who have family members with a BRCA-related cancer (breast, ovarian, tubal, or peritoneal cancers).  Mammogram.** / Every year beginning at age 41 years and continuing for as long as you are in good health. Consult with your health care provider.  Pap test.** / Every 3 years starting at age 65 years through age 37 or 70 years with a history of 3 consecutive normal Pap tests.  HPV screening.** / Every 3 years from ages 72 years through ages 60 to 40 years with a history of 3 consecutive normal Pap tests.  Fecal occult blood test (FOBT) of stool. / Every year beginning at age 21 years and continuing until age 5 years. You may not need to do this test if you get  a colonoscopy every 10 years.  Flexible sigmoidoscopy or colonoscopy.** / Every 5 years for a flexible sigmoidoscopy or every 10 years for a colonoscopy beginning at age 35 years and continuing until age 48 years.  Hepatitis C blood test.** / For all people born from [birth year] through 1965 and any individual with known risks for hepatitis C.  Skin self-exam. / Monthly.  Influenza vaccine. / Every year.  Tetanus, diphtheria, and acellular pertussis (Tdap/Td) vaccine.** / Consult your health care provider. Pregnant women should receive 1 dose of Tdap vaccine during each pregnancy. 1 dose of Td every 10 years.  Varicella vaccine.** / Consult your health care provider. Pregnant females who do not have evidence of immunity should receive the first dose after pregnancy.  Zoster vaccine.** / 1 dose for adults aged 30 years or older.  Measles, mumps, rubella (MMR) vaccine.** / You need at least 1 dose of MMR if you were born in 1957 or later. You may also need a second dose. For females of childbearing age, rubella immunity should be determined. If there is no evidence of immunity, females who are not pregnant should be vaccinated. If there is no evidence of immunity, females who are pregnant should delay immunization until after pregnancy.  Pneumococcal 13-valent conjugate (PCV13) vaccine.** / Consult your health care provider.  Pneumococcal polysaccharide (PPSV23) vaccine.** / 1 to 2 doses if you smoke cigarettes or if you have certain conditions.  Meningococcal vaccine.** /  Consult your health care provider.  Hepatitis A vaccine.** / Consult your health care provider.  Hepatitis B vaccine.** / Consult your health care provider.  Haemophilus influenzae type b (Hib) vaccine.** / Consult your health care provider. Ages 64 years and over  Blood pressure check.** / Every year.  Lipid and cholesterol check.** / Every 5 years beginning at age 23 years.  Lung cancer screening. / Every year if you  are aged 16-80 years and have a 30-pack-year history of smoking and currently smoke or have quit within the past 15 years. Yearly screening is stopped once you have quit smoking for at least 15 years or develop a health problem that would prevent you from having lung cancer treatment.  Clinical breast exam.** / Every year after age 74 years.  BRCA-related cancer risk assessment.** / For women who have family members with a BRCA-related cancer (breast, ovarian, tubal, or peritoneal cancers).  Mammogram.** / Every year beginning at age 44 years and continuing for as long as you are in good health. Consult with your health care provider.  Pap test.** / Every 3 years starting at age 58 years through age 22 or 39 years with 3 consecutive normal Pap tests. Testing can be stopped between 65 and 70 years with 3 consecutive normal Pap tests and no abnormal Pap or HPV tests in the past 10 years.  HPV screening.** / Every 3 years from ages 64 years through ages 70 or 61 years with a history of 3 consecutive normal Pap tests. Testing can be stopped between 65 and 70 years with 3 consecutive normal Pap tests and no abnormal Pap or HPV tests in the past 10 years.  Fecal occult blood test (FOBT) of stool. / Every year beginning at age 40 years and continuing until age 27 years. You may not need to do this test if you get a colonoscopy every 10 years.  Flexible sigmoidoscopy or colonoscopy.** / Every 5 years for a flexible sigmoidoscopy or every 10 years for a colonoscopy beginning at age 7 years and continuing until age 32 years.  Hepatitis C blood test.** / For all people born from [birth year] through 1966 and any individual with known risks for hepatitis C.  Osteoporosis screening.** / A one-time screening for women ages 30 years and over and women at risk for fractures or osteoporosis.  Skin self-exam. / Monthly.  Influenza vaccine. / Every year.  Tetanus, diphtheria, and acellular pertussis (Tdap/Td)  vaccine.** / 1 dose of Td every 10 years.  Varicella vaccine.** / Consult your health care provider.  Zoster vaccine.** / 1 dose for adults aged 35 years or older.  Pneumococcal 13-valent conjugate (PCV13) vaccine.** / Consult your health care provider.  Pneumococcal polysaccharide (PPSV23) vaccine.** / 1 dose for all adults aged 46 years and older.  Meningococcal vaccine.** / Consult your health care provider.  Hepatitis A vaccine.** / Consult your health care provider.  Hepatitis B vaccine.** / Consult your health care provider.  Haemophilus influenzae type b (Hib) vaccine.** / Consult your health care provider. ** Family history and personal history of risk and conditions may change your health care provider's recommendations.   This information is not intended to replace advice given to you by your health care provider. Make sure you discuss any questions you have with your health care provider.   Document Released: 07/21/2001 Document Revised: 06/15/2014 Document Reviewed: 10/20/2010 Elsevier Interactive Patient Education [employer].

## 2015-10-15 NOTE — Progress Notes (Signed)
Subjective:     Anita Galloway is a 60 y.o. female and is here for a comprehensive physical exam. The patient reports no problems.  Social History   Social History  . Marital Status: Married    Spouse Name: N/A  . Number of Children: N/A  . Years of Education: N/A   Occupational History  . [employer] -- controller    Social History Main Topics  . Smoking status: Never Smoker   . Smokeless tobacco: Never Used  . Alcohol Use: Yes  . Drug Use: No  . Sexual Activity:    Partners: Male   Other Topics Concern  . Not on file   Social History Narrative   Exercising-- treadmill, walk, elliptical   Health Maintenance  Topic Date Due  . Hepatitis C Screening  1956/05/31  . ZOSTAVAX  05/18/2015  . INFLUENZA VACCINE  01/07/2016  . DEXA SCAN  03/06/2016  . MAMMOGRAM  03/11/2017  . COLONOSCOPY  03/07/2018  . PAP SMEAR  03/08/2018  . TETANUS/TDAP  10/10/2024  . HIV Screening  Completed    The following portions of the patient's history were reviewed and updated as appropriate:  She  has a past medical history of Phlebitis. She  does not have any pertinent problems on file. She  has past surgical history that includes Cesarean section; Mass excision (02/23/2012); and Dilation and curettage of uterus (11/2013). Her family history includes Alzheimer's disease in her mother; Cancer (age of onset: 77) in her mother; Diabetes in her father; Heart disease in her father; Mental retardation in her sister; Osteoporosis in her sister. She  reports that she has never smoked. She has never used smokeless tobacco. She reports that she drinks alcohol. She reports that she does not use illicit drugs. She has a current medication list which includes the following prescription(s): aspirin, calcium-vitamin d, cholecalciferol, multivitamin, thiamine, and zolpidem. Current Outpatient Prescriptions on File Prior to Visit  Medication Sig Dispense Refill  . aspirin 81 MG tablet Take 81 mg by mouth  daily.    . Calcium Carbonate-Vitamin D (CALCIUM-VITAMIN D) 500-200 MG-UNIT per tablet Take 1 tablet by mouth daily.    . cholecalciferol (VITAMIN D) 1000 UNITS tablet Take 1,000 Units by mouth daily.    . Multiple Vitamin (MULTIVITAMIN) tablet Take 1 tablet by mouth daily.    Marland Kitchen thiamine (VITAMIN B-1) 100 MG tablet Take 100 mg by mouth daily.    Marland Kitchen zolpidem (AMBIEN) 5 MG tablet TAKE 1 TABLET BY MOUTH AT BEDTIME AS NEEDED 30 tablet 4   No current facility-administered medications on file prior to visit.   She has No Known Allergies..  Review of Systems Review of Systems  Constitutional: Negative for activity change, appetite change and fatigue.  HENT: Negative for hearing loss, congestion, tinnitus and ear discharge.  dentist q39m Eyes: Negative for visual disturbance (see optho q1y -- vision corrected to 20/20 with glasses).  Respiratory: Negative for cough, chest tightness and shortness of breath.   Cardiovascular: Negative for chest pain, palpitations and leg swelling.  Gastrointestinal: Negative for abdominal pain, diarrhea, constipation and abdominal distention.  Genitourinary: Negative for urgency, frequency, decreased urine volume and difficulty urinating.  Musculoskeletal: Negative for back pain, arthralgias and gait problem.  Skin: Negative for color change, pallor and rash.  Neurological: Negative for dizziness, light-headedness, numbness and headaches.  Hematological: Negative for adenopathy. Does not bruise/bleed easily.  Psychiatric/Behavioral: Negative for suicidal ideas, confusion, sleep disturbance, self-injury, dysphoric mood, decreased concentration and agitation.  Objective:    BP 113/80 mmHg  Pulse 64  Temp(Src) 98.1 F (36.7 C) (Oral)  Ht 5\' 6"  (1.676 m)  Wt 153 lb 9.6 oz (69.673 kg)  BMI 24.80 kg/m2  SpO2 100% General appearance: alert, cooperative, appears stated age and no distress Head: Normocephalic, without obvious abnormality, atraumatic Eyes:  conjunctivae/corneas clear. PERRL, EOM's intact. Fundi benign. Ears: normal TM's and external ear canals both ears Nose: Nares normal. Septum midline. Mucosa normal. No drainage or sinus tenderness. Throat: lips, mucosa, and tongue normal; teeth and gums normal Neck: no adenopathy, no carotid bruit, no JVD, supple, symmetrical, trachea midline and thyroid not enlarged, symmetric, no tenderness/mass/nodules Back: symmetric, no curvature. ROM normal. No CVA tenderness. Lungs: clear to auscultation bilaterally Breasts: gyn Heart: regular rate and rhythm, S1, S2 normal, no murmur, click, rub or gallop Abdomen: soft, non-tender; bowel sounds normal; no masses,  no organomegaly Pelvic: deferred Extremities: extremities normal, atraumatic, no cyanosis or edema Pulses: 2+ and symmetric Skin: Skin color, texture, turgor normal. No rashes or lesions Lymph nodes: Cervical, supraclavicular, and axillary nodes normal. Neurologic: Alert and oriented X 3, normal strength and tone. Normal symmetric reflexes. Normal coordination and gait    Assessment:    Healthy female exam.      Plan:    ghm utd Check labs See After Visit Summary for Counseling Recommendations    1. Need for hepatitis C screening test   - Hepatitis C Antibody  2. Preventative health care  ghm utd Check labs - POCT urinalysis dipstick - TSH - Lipid panel - CBC with Differential/Platelet - Comprehensive metabolic panel  3. Need for shingles vaccine   - Varicella-zoster vaccine subcutaneous

## 2015-10-15 NOTE — Progress Notes (Signed)
Pre visit review using our clinic review tool, if applicable. No additional management support is needed unless otherwise documented below in the visit note. 

## 2015-10-16 LAB — HEPATITIS C ANTIBODY: HCV Ab: NEGATIVE

## 2016-02-13 ENCOUNTER — Other Ambulatory Visit: Payer: Self-pay | Admitting: Obstetrics & Gynecology

## 2016-02-13 DIAGNOSIS — Z1231 Encounter for screening mammogram for malignant neoplasm of breast: Secondary | ICD-10-CM

## 2016-03-13 ENCOUNTER — Ambulatory Visit: Payer: BLUE CROSS/BLUE SHIELD

## 2016-03-16 ENCOUNTER — Ambulatory Visit
Admission: RE | Admit: 2016-03-16 | Discharge: 2016-03-16 | Disposition: A | Discharge status: Home / Self Care | Payer: BLUE CROSS/BLUE SHIELD | Source: Ambulatory Visit | Attending: Obstetrics & Gynecology | Admitting: Obstetrics & Gynecology

## 2016-03-16 DIAGNOSIS — Z1231 Encounter for screening mammogram for malignant neoplasm of breast: Secondary | ICD-10-CM

## 2016-10-15 ENCOUNTER — Encounter: Payer: Self-pay | Admitting: Family Medicine

## 2016-10-15 ENCOUNTER — Ambulatory Visit (INDEPENDENT_AMBULATORY_CARE_PROVIDER_SITE_OTHER): Payer: BLUE CROSS/BLUE SHIELD | Admitting: Family Medicine

## 2016-10-15 VITALS — BP 126/76 | HR 69 | Temp 98.2°F | Resp 16 | Ht 66.4 in | Wt 150.4 lb

## 2016-10-15 DIAGNOSIS — Z0000 Encounter for general adult medical examination without abnormal findings: Secondary | ICD-10-CM

## 2016-10-15 LAB — POC URINALSYSI DIPSTICK (AUTOMATED)
Bilirubin, UA: NEGATIVE
Blood, UA: NEGATIVE
Glucose, UA: NEGATIVE
Ketones, UA: NEGATIVE
Leukocytes, UA: NEGATIVE
Nitrite, UA: NEGATIVE
Protein, UA: NEGATIVE
Spec Grav, UA: >=1.03 — AB (ref 1.010–1.025)
Urobilinogen, UA: 0.2 E.U./dL
pH, UA: 6 (ref 5.0–8.0)

## 2016-10-15 LAB — LIPID PANEL
Cholesterol: 209 mg/dL — ABNORMAL HIGH (ref 0–200)
HDL: 88.7 mg/dL (ref >39.00)
LDL Cholesterol: 107 mg/dL — ABNORMAL HIGH (ref 0–99)
NonHDL: 120.78
Total CHOL/HDL Ratio: 2
Triglycerides: 69 mg/dL (ref 0.0–149.0)
VLDL: 13.8 mg/dL (ref 0.0–40.0)

## 2016-10-15 LAB — TSH: TSH: 3.44 u[IU]/mL (ref 0.35–4.50)

## 2016-10-15 LAB — COMPREHENSIVE METABOLIC PANEL
ALT: 19 U/L (ref 0–35)
AST: 23 U/L (ref 0–37)
Albumin: 4.5 g/dL (ref 3.5–5.2)
Alkaline Phosphatase: 68 U/L (ref 39–117)
BUN: 14 mg/dL (ref 6–23)
CO2: 29 mEq/L (ref 19–32)
Calcium: 9.4 mg/dL (ref 8.4–10.5)
Chloride: 105 mEq/L (ref 96–112)
Creatinine, Ser: 0.95 mg/dL (ref 0.40–1.20)
GFR: 63.48 mL/min (ref >60.00)
Glucose, Bld: 95 mg/dL (ref 70–99)
Potassium: 4.4 mEq/L (ref 3.5–5.1)
Sodium: 140 mEq/L (ref 135–145)
Total Bilirubin: 0.5 mg/dL (ref 0.2–1.2)
Total Protein: 6.6 g/dL (ref 6.0–8.3)

## 2016-10-15 LAB — CBC
HCT: 39.9 % (ref 36.0–46.0)
Hemoglobin: 13.3 g/dL (ref 12.0–15.0)
MCHC: 33.4 g/dL (ref 30.0–36.0)
MCV: 95.4 fl (ref 78.0–100.0)
Platelets: 329 10*3/uL (ref 150.0–400.0)
RBC: 4.18 Mil/uL (ref 3.87–5.11)
RDW: 13.6 % (ref 11.5–15.5)
WBC: 6.4 10*3/uL (ref 4.0–10.5)

## 2016-10-15 NOTE — Patient Instructions (Signed)

## 2016-10-15 NOTE — Progress Notes (Signed)
Subjective:     Anita Galloway is a 61 y.o. female and is here for a comprehensive physical exam. The patient reports no problems.  Social History   Social History  . Marital status: Married    Spouse name: N/A  . Number of children: N/A  . Years of education: N/A   Occupational History  . [employer] -- controller    Social History Main Topics  . Smoking status: Never Smoker  . Smokeless tobacco: Never Used  . Alcohol use Yes  . Drug use: No  . Sexual activity: Yes    Partners: Male   Other Topics Concern  . Not on file   Social History Narrative   Exercising-- treadmill, walk, elliptical   Health Maintenance  Topic Date Due  . DEXA SCAN  03/06/2016  . INFLUENZA VACCINE  01/06/2017  . MAMMOGRAM  03/16/2017  . COLONOSCOPY  03/07/2018  . PAP SMEAR  03/08/2018  . TETANUS/TDAP  10/10/2024  . Hepatitis C Screening  Completed  . HIV Screening  Completed    The following portions of the patient's history were reviewed and updated as appropriate:  She  has a past medical history of Phlebitis. She  does not have any pertinent problems on file. She  has a past surgical history that includes Cesarean section; Mass excision (02/23/2012); Dilation and curettage of uterus (11/2013); and Mohs surgery (Left). Her family history includes Alzheimer's disease in her mother; Cancer (age of onset: 6672) in her mother; Diabetes in her father; Heart disease in her father; Mental retardation in her sister; Osteoporosis in her sister. She  reports that she has never smoked. She has never used smokeless tobacco. She reports that she drinks alcohol. She reports that she does not use drugs. She has a current medication list which includes the following prescription(s): aspirin, calcium-vitamin d, cetirizine, cholecalciferol, multivitamin, fish oil, thiamine, and zolpidem. Current Outpatient Prescriptions on File Prior to Visit  Medication Sig Dispense Refill  . aspirin 81 MG tablet Take 81 mg  by mouth daily.    . Calcium Carbonate-Vitamin D (CALCIUM-VITAMIN D) 500-200 MG-UNIT per tablet Take 1 tablet by mouth daily.    . cholecalciferol (VITAMIN D) 1000 UNITS tablet Take 1,000 Units by mouth daily.    . Multiple Vitamin (MULTIVITAMIN) tablet Take 1 tablet by mouth daily.    Marland Kitchen. thiamine (VITAMIN B-1) 100 MG tablet Take 100 mg by mouth daily.    Marland Kitchen. zolpidem (AMBIEN) 5 MG tablet TAKE 1 TABLET BY MOUTH AT BEDTIME AS NEEDED 30 tablet 4   No current facility-administered medications on file prior to visit.    She has No Known Allergies..  Review of Systems Review of Systems  Constitutional: Negative for activity change, appetite change and fatigue.  HENT: Negative for hearing loss, congestion, tinnitus and ear discharge.  dentist q6640m Eyes: Negative for visual disturbance (see optho q1y -- vision corrected to 20/20 with glasses).  Respiratory: Negative for cough, chest tightness and shortness of breath.   Cardiovascular: Negative for chest pain, palpitations and leg swelling.  Gastrointestinal: Negative for abdominal pain, diarrhea, constipation and abdominal distention.  Genitourinary: Negative for urgency, frequency, decreased urine volume and difficulty urinating.  Musculoskeletal: Negative for back pain, arthralgias and gait problem.  Skin: Negative for color change, pallor and rash.  Neurological: Negative for dizziness, light-headedness, numbness and headaches.  Hematological: Negative for adenopathy. Does not bruise/bleed easily.  Psychiatric/Behavioral: Negative for suicidal ideas, confusion, sleep disturbance, self-injury, dysphoric mood, decreased concentration and agitation.  Objective:    BP 126/76 (BP Location: Left Arm, Cuff Size: Normal)   Pulse 69   Temp 98.2 F (36.8 C) (Oral)   Resp 16   Ht 5' 6.4" (1.687 m)   Wt 150 lb 6.4 oz (68.2 kg)   SpO2 98%   BMI 23.98 kg/m  General appearance: alert, cooperative, appears stated age and no distress Head:  Normocephalic, without obvious abnormality, atraumatic Eyes: conjunctivae/corneas clear. PERRL, EOM's intact. Fundi benign. Ears: normal TM's and external ear canals both ears Nose: Nares normal. Septum midline. Mucosa normal. No drainage or sinus tenderness. Throat: lips, mucosa, and tongue normal; teeth and gums normal Neck: no adenopathy, no carotid bruit, no JVD, supple, symmetrical, trachea midline and thyroid not enlarged, symmetric, no tenderness/mass/nodules Back: symmetric, no curvature. ROM normal. No CVA tenderness. Lungs: clear to auscultation bilaterally Breasts: gyn Heart: regular rate and rhythm, S1, S2 normal, no murmur, click, rub or gallop Abdomen: soft, non-tender; bowel sounds normal; no masses,  no organomegaly Pelvic: deferred Extremities: extremities normal, atraumatic, no cyanosis or edema Pulses: 2+ and symmetric Skin: Skin color, texture, turgor normal. No rashes or lesions Lymph nodes: Cervical, supraclavicular, and axillary nodes normal. Neurologic: Alert and oriented X 3, normal strength and tone. Normal symmetric reflexes. Normal coordination and gait    Assessment:    Healthy female exam.      Plan:    ghm utd Check labs See After Visit Summary for Counseling  Recommendations    1. Preventative health care  See above - POCT Urinalysis Dipstick (Automated) - CBC - Comprehensive metabolic panel - Lipid panel - TSH

## 2016-11-17 ENCOUNTER — Telehealth: Payer: Self-pay | Admitting: Family Medicine

## 2016-11-17 NOTE — Telephone Encounter (Signed)
Faxed hardcopy for zolpide to [facility] [place]

## 2016-11-17 NOTE — Telephone Encounter (Signed)
Requesting:   zolpidem Contract    none UDS    none Last OV      10/15/2016---future appointment is on 10/19/2017 Last Refill    #30 with 4 refills on 06/27/2015  Please Advise

## 2016-11-20 ENCOUNTER — Other Ambulatory Visit: Payer: Self-pay | Admitting: Family Medicine

## 2016-11-23 NOTE — Telephone Encounter (Signed)
Called in this prescription to the pharmacy/gave a verbal on this prescription

## 2016-11-23 NOTE — Telephone Encounter (Signed)
[facility] not [facility]. Please send again

## 2017-02-02 ENCOUNTER — Other Ambulatory Visit: Payer: Self-pay | Admitting: Obstetrics & Gynecology

## 2017-02-02 DIAGNOSIS — Z1231 Encounter for screening mammogram for malignant neoplasm of breast: Secondary | ICD-10-CM

## 2017-03-22 ENCOUNTER — Ambulatory Visit
Admission: RE | Admit: 2017-03-22 | Discharge: 2017-03-22 | Disposition: A | Discharge status: Home / Self Care | Payer: BLUE CROSS/BLUE SHIELD | Source: Ambulatory Visit | Attending: Obstetrics & Gynecology | Admitting: Obstetrics & Gynecology

## 2017-03-22 DIAGNOSIS — Z1231 Encounter for screening mammogram for malignant neoplasm of breast: Secondary | ICD-10-CM

## 2017-10-19 ENCOUNTER — Encounter: Payer: Self-pay | Admitting: Family Medicine

## 2017-10-19 ENCOUNTER — Ambulatory Visit (INDEPENDENT_AMBULATORY_CARE_PROVIDER_SITE_OTHER): Payer: BLUE CROSS/BLUE SHIELD | Admitting: Family Medicine

## 2017-10-19 VITALS — BP 100/70 | HR 76 | Temp 97.6°F | Resp 16 | Ht 66.0 in | Wt 136.2 lb

## 2017-10-19 DIAGNOSIS — E559 Vitamin D deficiency, unspecified: Secondary | ICD-10-CM

## 2017-10-19 DIAGNOSIS — Z0000 Encounter for general adult medical examination without abnormal findings: Secondary | ICD-10-CM | POA: Diagnosis not present

## 2017-10-19 LAB — VITAMIN D 25 HYDROXY (VIT D DEFICIENCY, FRACTURES): VITD: 36.72 ng/mL (ref 30.00–100.00)

## 2017-10-19 LAB — CBC WITH DIFFERENTIAL/PLATELET
Basophils Absolute: 0 10*3/uL (ref 0.0–0.1)
Basophils Relative: 0.5 % (ref 0.0–3.0)
Eosinophils Absolute: 0.1 10*3/uL (ref 0.0–0.7)
Eosinophils Relative: 1.9 % (ref 0.0–5.0)
HCT: 39.5 % (ref 36.0–46.0)
Hemoglobin: 13.3 g/dL (ref 12.0–15.0)
Lymphocytes Relative: 26.5 % (ref 12.0–46.0)
Lymphs Abs: 1.7 10*3/uL (ref 0.7–4.0)
MCHC: 33.6 g/dL (ref 30.0–36.0)
MCV: 94.5 fl (ref 78.0–100.0)
Monocytes Absolute: 0.5 10*3/uL (ref 0.1–1.0)
Monocytes Relative: 6.9 % (ref 3.0–12.0)
Neutro Abs: 4.2 10*3/uL (ref 1.4–7.7)
Neutrophils Relative %: 64.2 % (ref 43.0–77.0)
Platelets: 315 10*3/uL (ref 150.0–400.0)
RBC: 4.18 Mil/uL (ref 3.87–5.11)
RDW: 13.9 % (ref 11.5–15.5)
WBC: 6.5 10*3/uL (ref 4.0–10.5)

## 2017-10-19 LAB — COMPREHENSIVE METABOLIC PANEL
ALT: 14 U/L (ref 0–35)
AST: 18 U/L (ref 0–37)
Albumin: 4.3 g/dL (ref 3.5–5.2)
Alkaline Phosphatase: 66 U/L (ref 39–117)
BUN: 16 mg/dL (ref 6–23)
CO2: 28 mEq/L (ref 19–32)
Calcium: 9.7 mg/dL (ref 8.4–10.5)
Chloride: 104 mEq/L (ref 96–112)
Creatinine, Ser: 0.87 mg/dL (ref 0.40–1.20)
GFR: 70.03 mL/min (ref >60.00)
Glucose, Bld: 101 mg/dL — ABNORMAL HIGH (ref 70–99)
Potassium: 5.4 mEq/L — ABNORMAL HIGH (ref 3.5–5.1)
Sodium: 140 mEq/L (ref 135–145)
Total Bilirubin: 0.6 mg/dL (ref 0.2–1.2)
Total Protein: 6.7 g/dL (ref 6.0–8.3)

## 2017-10-19 LAB — LIPID PANEL
Cholesterol: 206 mg/dL — ABNORMAL HIGH (ref 0–200)
HDL: 83.6 mg/dL (ref >39.00)
LDL Cholesterol: 107 mg/dL — ABNORMAL HIGH (ref 0–99)
NonHDL: 122.25
Total CHOL/HDL Ratio: 2
Triglycerides: 76 mg/dL (ref 0.0–149.0)
VLDL: 15.2 mg/dL (ref 0.0–40.0)

## 2017-10-19 NOTE — Progress Notes (Signed)
Subjective:     Anita Galloway is a 62 y.o. female and is here for a comprehensive physical exam. The patient reports no problems. Pt is on optavia diet and has lost 15 lbs .       Social History   Socioeconomic History  . Marital status: Married    Spouse name: Not on file  . Number of children: Not on file  . Years of education: Not on file  . Highest education level: Not on file  Occupational History  . 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 Financial resource strain: Not on file  . Food insecurity:    Worry: Not on file    Inability: Not on file  . Transportation needs:    Medical: Not on file    Non-medical: Not on file  Tobacco Use  . Smoking status: Former Games developer  . Smokeless tobacco: Never Used  . Tobacco comment: smoked in college  Substance and Sexual Activity  . Alcohol use: Yes  . Drug use: No  . Sexual activity: Yes    Partners: Male  Lifestyle  . Physical activity:    Days per week: Not on file    Minutes per session: Not on file  . Stress: Not on file  Relationships  . Social connections:    Talks on phone: Not on file    Gets together: Not on file    Attends religious service: Not on file    Active member of club or organization: Not on file    Attends meetings of clubs or organizations: Not on file    Relationship status: Not on file  . Intimate partner violence:    Fear of current or ex partner: Not on file    Emotionally abused: Not on file    Physically abused: Not on file    Forced sexual activity: Not on file  Other Topics Concern  . Not on file  Social History Narrative   Exercising-- treadmill, walk, elliptical   Health Maintenance  Topic Date Due  . DEXA SCAN  03/06/2016  . INFLUENZA VACCINE  01/06/2018  . COLONOSCOPY  03/07/2018  . PAP SMEAR  03/08/2018  . MAMMOGRAM  03/22/2018  . TETANUS/TDAP  10/10/2024  . Hepatitis C Screening  Completed  . HIV Screening  Completed    The following portions of the  patient's history were reviewed and updated as appropriate:  She  has a past medical history of Phlebitis. She does not have any pertinent problems on file. She  has a past surgical history that includes Cesarean section; Mass excision (02/23/2012); Dilation and curettage of uterus (11/2013); and Mohs surgery (Left). Her family history includes Alzheimer's disease in her mother; Breast cancer in her mother and unknown relative; Cancer (age of onset: 14) in her mother; Coronary artery disease in her unknown relative; Diabetes in her father and unknown relative; Heart disease in her father; Mental retardation in her sister; Osteoporosis in her sister. She  reports that she has quit smoking. She has never used smokeless tobacco. She reports that she drinks alcohol. She reports that she does not use drugs. She has a current medication list which includes the following prescription(s): aspirin, calcium-vitamin d, cetirizine, cholecalciferol, multivitamin, fish oil, thiamine, and zolpidem. Current Outpatient Medications on File Prior to Visit  Medication Sig Dispense Refill  . aspirin 81 MG tablet Take 81 mg by mouth daily.    . Calcium Carbonate-Vitamin D (CALCIUM-VITAMIN D) 500-200 MG-UNIT per tablet  Take 1 tablet by mouth daily.    . cetirizine (ZYRTEC) 10 MG tablet Take 10 mg by mouth daily.    . cholecalciferol (VITAMIN D) 1000 UNITS tablet Take 1,000 Units by mouth daily.    . Multiple Vitamin (MULTIVITAMIN) tablet Take 1 tablet by mouth daily.    . Omega-3 Fatty Acids (FISH OIL) 1000 MG CAPS Take 1 capsule by mouth daily.    Marland Kitchen thiamine (VITAMIN B-1) 100 MG tablet Take 100 mg by mouth daily.    Marland Kitchen zolpidem (AMBIEN) 5 MG tablet TAKE 1 TABLET BY MOUTH AT BEDTIME AS NEEDED 30 tablet 1   No current facility-administered medications on file prior to visit.    She has No Known Allergies..  Review of Systems Review of Systems  Constitutional: Negative for activity change, appetite change and fatigue.   HENT: Negative for hearing loss, congestion, tinnitus and ear discharge.  dentist q56m Eyes: Negative for visual disturbance (see optho q1y -- vision corrected to 20/20 with glasses).  Respiratory: Negative for cough, chest tightness and shortness of breath.   Cardiovascular: Negative for chest pain, palpitations and leg swelling.  Gastrointestinal: Negative for abdominal pain, diarrhea, constipation and abdominal distention.  Genitourinary: Negative for urgency, frequency, decreased urine volume and difficulty urinating.  Musculoskeletal: Negative for back pain, arthralgias and gait problem.  Skin: Negative for color change, pallor and rash.  Neurological: Negative for dizziness, light-headedness, numbness and headaches.  Hematological: Negative for adenopathy. Does not bruise/bleed easily.  Psychiatric/Behavioral: Negative for suicidal ideas, confusion, sleep disturbance, self-injury, dysphoric mood, decreased concentration and agitation.       Objective:    BP 100/70 (BP Location: Left Arm, Cuff Size: Normal)   Pulse 76   Temp 97.6 F (36.4 C) (Oral)   Resp 16   Ht  (1.676 m)   Wt 136 lb 3.2 oz (61.8 kg)   SpO2 97%   BMI 21.98 kg/m  General appearance: alert, cooperative, appears stated age and no distress Head: Normocephalic, without obvious abnormality, atraumatic Eyes: negative findings: lids and lashes normal, conjunctivae and sclerae normal and pupils equal, round, reactive to light and accomodation Ears: normal TM's and external ear canals both ears Nose: Nares normal. Septum midline. Mucosa normal. No drainage or sinus tenderness. Throat: lips, mucosa, and tongue normal; teeth and gums normal Neck: no adenopathy, no carotid bruit, no JVD, supple, symmetrical, trachea midline and thyroid not enlarged, symmetric, no tenderness/mass/nodules Back: symmetric, no curvature. ROM normal. No CVA tenderness. Lungs: clear to auscultation bilaterally Breasts: deferred Heart:  regular rate and rhythm, S1, S2 normal, no murmur, click, rub or gallop Abdomen: soft, non-tender; bowel sounds normal; no masses,  no organomegaly Pelvic: deferred--gyn Extremities: extremities normal, atraumatic, no cyanosis or edema Pulses: 2+ and symmetric Skin: Skin color, texture, turgor normal. No rashes or lesions Lymph nodes: Cervical, supraclavicular, and axillary nodes normal. Neurologic: Alert and oriented X 3, normal strength and tone. Normal symmetric reflexes. Normal coordination and gait    Assessment:    Healthy female exam.      Plan:    ghm utd Check labs  See After Visit Summary for Counseling Recommendations   1. Preventative health care See above - CBC with Differential/Platelet - Comprehensive metabolic panel - Lipid panel  2. Vitamin D deficiency Check labs  - Vitamin D (25 hydroxy)

## 2017-10-19 NOTE — Patient Instructions (Signed)
Preventive Care 40-64 Years, Female Preventive care refers to lifestyle choices and visits with your health care provider that can promote health and wellness. What does preventive care include?  A yearly physical exam. This is also called an annual well check.  Dental exams once or twice a year.  Routine eye exams. Ask your health care provider how often you should have your eyes checked.  Personal lifestyle choices, including: ? Daily care of your teeth and gums. ? Regular physical activity. ? Eating a healthy diet. ? Avoiding tobacco and drug use. ? Limiting alcohol use. ? Practicing safe sex. ? Taking low-dose aspirin daily starting at age 58. ? Taking vitamin and mineral supplements as recommended by your health care provider. What happens during an annual well check? The services and screenings done by your health care provider during your annual well check will depend on your age, overall health, lifestyle risk factors, and family history of disease. Counseling Your health care provider may ask you questions about your:  Alcohol use.  Tobacco use.  Drug use.  Emotional well-being.  Home and relationship well-being.  Sexual activity.  Eating habits.  Work and work Statistician.  Method of birth control.  Menstrual cycle.  Pregnancy history.  Screening You may have the following tests or measurements:  Height, weight, and BMI.  Blood pressure.  Lipid and cholesterol levels. These may be checked every 5 years, or more frequently if you are over 81 years old.  Skin check.  Lung cancer screening. You may have this screening every year starting at age 78 if you have a 30-pack-year history of smoking and currently smoke or have quit within the past 15 years.  Fecal occult blood test (FOBT) of the stool. You may have this test every year starting at age 65.  Flexible sigmoidoscopy or colonoscopy. You may have a sigmoidoscopy every 5 years or a colonoscopy  every 10 years starting at age 30.  Hepatitis C blood test.  Hepatitis B blood test.  Sexually transmitted disease (STD) testing.  Diabetes screening. This is done by checking your blood sugar (glucose) after you have not eaten for a while (fasting). You may have this done every 1-3 years.  Mammogram. This may be done every 1-2 years. Talk to your health care provider about when you should start having regular mammograms. This may depend on whether you have a family history of breast cancer.  BRCA-related cancer screening. This may be done if you have a family history of breast, ovarian, tubal, or peritoneal cancers.  Pelvic exam and Pap test. This may be done every 3 years starting at age 80. Starting at age 36, this may be done every 5 years if you have a Pap test in combination with an HPV test.  Bone density scan. This is done to screen for osteoporosis. You may have this scan if you are at high risk for osteoporosis.  Discuss your test results, treatment options, and if necessary, the need for more tests with your health care provider. Vaccines Your health care provider may recommend certain vaccines, such as:  Influenza vaccine. This is recommended every year.  Tetanus, diphtheria, and acellular pertussis (Tdap, Td) vaccine. You may need a Td booster every 10 years.  Varicella vaccine. You may need this if you have not been vaccinated.  Zoster vaccine. You may need this after age 5.  Measles, mumps, and rubella (MMR) vaccine. You may need at least one dose of MMR if you were born in  1957 or later. You may also need a second dose.  Pneumococcal 13-valent conjugate (PCV13) vaccine. You may need this if you have certain conditions and were not previously vaccinated.  Pneumococcal polysaccharide (PPSV23) vaccine. You may need one or two doses if you smoke cigarettes or if you have certain conditions.  Meningococcal vaccine. You may need this if you have certain  conditions.  Hepatitis A vaccine. You may need this if you have certain conditions or if you travel or work in places where you may be exposed to hepatitis A.  Hepatitis B vaccine. You may need this if you have certain conditions or if you travel or work in places where you may be exposed to hepatitis B.  Haemophilus influenzae type b (Hib) vaccine. You may need this if you have certain conditions.  Talk to your health care provider about which screenings and vaccines you need and how often you need them. This information is not intended to replace advice given to you by your health care provider. Make sure you discuss any questions you have with your health care provider. Document Released: 06/21/2015 Document Revised: 02/12/2016 Document Reviewed: 03/26/2015 Elsevier Interactive Patient Education  2018 Elsevier Inc.  

## 2018-01-14 ENCOUNTER — Encounter: Payer: Self-pay | Admitting: Gastroenterology

## 2018-03-03 ENCOUNTER — Other Ambulatory Visit: Payer: Self-pay | Admitting: Obstetrics & Gynecology

## 2018-03-03 DIAGNOSIS — Z1231 Encounter for screening mammogram for malignant neoplasm of breast: Secondary | ICD-10-CM

## 2018-04-04 ENCOUNTER — Ambulatory Visit
Admission: RE | Admit: 2018-04-04 | Discharge: 2018-04-04 | Disposition: A | Discharge status: Home / Self Care | Payer: BLUE CROSS/BLUE SHIELD | Source: Ambulatory Visit | Attending: Obstetrics & Gynecology | Admitting: Obstetrics & Gynecology

## 2018-04-04 DIAGNOSIS — Z1231 Encounter for screening mammogram for malignant neoplasm of breast: Secondary | ICD-10-CM

## 2018-05-19 ENCOUNTER — Encounter: Payer: Self-pay | Admitting: Internal Medicine

## 2018-05-19 ENCOUNTER — Telehealth: Payer: Self-pay | Admitting: Internal Medicine

## 2018-05-19 NOTE — Telephone Encounter (Signed)
My pleasure.

## 2018-06-03 ENCOUNTER — Ambulatory Visit (AMBULATORY_SURGERY_CENTER): Payer: Self-pay | Admitting: *Deleted

## 2018-06-03 VITALS — Ht 66.0 in | Wt 141.0 lb

## 2018-06-03 DIAGNOSIS — Z1211 Encounter for screening for malignant neoplasm of colon: Secondary | ICD-10-CM

## 2018-06-03 MED ORDER — NA SULFATE-K SULFATE-MG SULF 17.5-3.13-1.6 GM/177ML PO SOLN: ORAL | 0 refills | Status: DC

## 2018-06-03 NOTE — Progress Notes (Signed)
Patient denies any allergies to eggs or soy. Patient denies any problems with anesthesia/sedation. Patient denies any oxygen use at home. Patient denies taking any diet/weight loss medications or blood thinners. EMMI education offered, pt declined. Suprep $15 off coupon given to pt.  

## 2018-06-10 ENCOUNTER — Encounter: Payer: Self-pay | Admitting: Internal Medicine

## 2018-06-20 ENCOUNTER — Encounter: Payer: Self-pay | Admitting: Internal Medicine

## 2018-06-20 ENCOUNTER — Ambulatory Visit (AMBULATORY_SURGERY_CENTER): Payer: BLUE CROSS/BLUE SHIELD | Admitting: Internal Medicine

## 2018-06-20 VITALS — BP 99/66 | HR 65 | Temp 98.6°F | Resp 9 | Ht 66.0 in | Wt 141.0 lb

## 2018-06-20 DIAGNOSIS — Z1211 Encounter for screening for malignant neoplasm of colon: Secondary | ICD-10-CM

## 2018-06-20 MED ORDER — SODIUM CHLORIDE 0.9 % IV SOLN: 500.0000 mL | Freq: Once | INTRAVENOUS | Status: DC

## 2018-06-20 NOTE — Progress Notes (Signed)
PT taken to PACU. Monitors in place. VSS. Report given to RN. 

## 2018-06-20 NOTE — Op Note (Signed)
[facility] Patient Name: Anita LogesMarion Galloway Procedure Date: 06/20/2018 8:38 AM MRN: [id number] Endoscopist: Wilhemina BonitoJohn N. Marina GoodellPerry , MD Age: [id number] Referring MD:  Date of Birth: 1956-01-14 Gender: Female Account #: [id number] Procedure:                Colonoscopy Indications:              Screening for colorectal malignant neoplasm.                            Negative index exam September 2009 (Dr. Juanda ChanceBrodie) Medicines:                Monitored Anesthesia Care Procedure:                Pre-Anesthesia Assessment:                           - Prior to the procedure, a History and Physical                            was performed, and patient medications and                            allergies were reviewed. The patient's tolerance of                            previous anesthesia was also reviewed. The risks                            and benefits of the procedure and the sedation                            options and risks were discussed with the patient.                            All questions were answered, and informed consent                            was obtained. Prior Anticoagulants: The patient has                            taken no previous anticoagulant or antiplatelet                            agents. ASA Grade Assessment: I - A normal, healthy                            patient. After reviewing the risks and benefits,                            the patient was deemed in satisfactory condition to                            undergo the procedure.  After obtaining informed consent, the colonoscope                            was passed under direct vision. Throughout the                            procedure, the patient's blood pressure, pulse, and                            oxygen saturations were monitored continuously. The                            Model CF-HQ190L [id number]) scope was introduced                            through the anus and advanced  to the the cecum,                            identified by appendiceal orifice and ileocecal                            valve. The ileocecal valve, appendiceal orifice,                            and rectum were photographed. The quality of the                            bowel preparation was excellent. The colonoscopy                            was performed without difficulty. The patient                            tolerated the procedure well. The bowel preparation                            used was SUPREP. Scope In: 8:49:45 AM Scope Out: 9:06:30 AM Scope Withdrawal Time: 0 hours 12 minutes 44 seconds  Total Procedure Duration: 0 hours 16 minutes 45 seconds  Findings:                 Multiple diverticula were found in the left colon.                           The exam was otherwise without abnormality on                            direct and retroflexion views. Complications:            No immediate complications. Estimated blood loss:                            None. Estimated Blood Loss:     Estimated blood loss: none. Impression:               - Diverticulosis in  the left colon.                           - The examination was otherwise normal on direct                            and retroflexion views.                           - No specimens collected. Recommendation:           - Repeat colonoscopy in 10 years for screening                            purposes.                           - Patient has a contact number available for                            emergencies. The signs and symptoms of potential                            delayed complications were discussed with the                            patient. Return to normal activities tomorrow.                            Written discharge instructions were provided to the                            patient.                           - Resume previous diet.                           - Continue present medications. Wilhemina Bonito.  Marina Goodell, MD 06/20/2018 9:10:45 AM This report has been signed electronically.

## 2018-06-20 NOTE — Patient Instructions (Signed)
Impression/Recommendations:  Diverticulosis handout given to patient.  Repeat colonoscopy in 10 years for screening.  Resume previous diet. Continue present medications.  YOU HAD AN ENDOSCOPIC PROCEDURE TODAY AT THE [facility]:   Refer to the procedure report that was given to you for any specific questions about what was found during the examination.  If the procedure report does not answer your questions, please call your gastroenterologist to clarify.  If you requested that your care partner not be given the details of your procedure findings, then the procedure report has been included in a sealed envelope for you to review at your convenience later.  YOU SHOULD EXPECT: Some feelings of bloating in the abdomen. Passage of more gas than usual.  Walking can help get rid of the air that was put into your GI tract during the procedure and reduce the bloating. If you had a lower endoscopy (such as a colonoscopy or flexible sigmoidoscopy) you may notice spotting of blood in your stool or on the toilet paper. If you underwent a bowel prep for your procedure, you may not have a normal bowel movement for a few days.  Please Note:  You might notice some irritation and congestion in your nose or some drainage.  This is from the oxygen used during your procedure.  There is no need for concern and it should clear up in a day or so.  SYMPTOMS TO REPORT IMMEDIATELY:   Following lower endoscopy (colonoscopy or flexible sigmoidoscopy):  Excessive amounts of blood in the stool  Significant tenderness or worsening of abdominal pains  Swelling of the abdomen that is new, acute  Fever of 100F or higher  For urgent or emergent issues, a gastroenterologist can be reached at any hour by calling [phone].   DIET:  We do recommend a small meal at first, but then you may proceed to your regular diet.  Drink plenty of fluids but you should avoid alcoholic beverages for 24 hours.  ACTIVITY:   You should plan to take it easy for the rest of today and you should NOT DRIVE or use heavy machinery until tomorrow (because of the sedation medicines used during the test).    FOLLOW UP: Our staff will call the number listed on your records the next business day following your procedure to check on you and address any questions or concerns that you may have regarding the information given to you following your procedure. If we do not reach you, we will leave a message.  However, if you are feeling well and you are not experiencing any problems, there is no need to return our call.  We will assume that you have returned to your regular daily activities without incident.  If any biopsies were taken you will be contacted by phone or by letter within the next 1-3 weeks.  Please call us at [phone] if you have not heard about the biopsies in 3 weeks.    SIGNATURES/CONFIDENTIALITY: You and/or your care partner have signed paperwork which will be entered into your electronic medical record.  These signatures attest to the fact that that the information above on your After Visit Summary has been reviewed and is understood.  Full responsibility of the confidentiality of this discharge information lies with you and/or your care-partner. 

## 2018-06-21 ENCOUNTER — Telehealth: Payer: Self-pay | Admitting: *Deleted

## 2018-06-21 NOTE — Telephone Encounter (Signed)
First follow up call attempt.  Reached answering machine with no identifiers.  No message left. 

## 2018-06-21 NOTE — Telephone Encounter (Signed)
Second follow up call attempt.  Reached answering machine with no identifiers.  No message left. 

## 2018-09-05 ENCOUNTER — Other Ambulatory Visit: Payer: Self-pay | Admitting: Family Medicine

## 2018-09-06 NOTE — Telephone Encounter (Signed)
Last written: 11/17/16  Last ov: 10/19/17 Next ov: 10/25/18 Contract: none (can get at next visit) UDS: none (can get at next visit)

## 2018-10-24 ENCOUNTER — Telehealth: Payer: Self-pay | Admitting: *Deleted

## 2018-10-24 NOTE — Telephone Encounter (Signed)
Called to leave message for patient to call back to either reschedule for do virtual visit (if she still has bcbs) for cpe.

## 2018-10-25 ENCOUNTER — Encounter: Payer: BLUE CROSS/BLUE SHIELD | Admitting: Family Medicine

## 2019-02-07 HISTORY — PX: OTHER SURGICAL HISTORY: SHX169

## 2019-02-24 ENCOUNTER — Encounter: Payer: BLUE CROSS/BLUE SHIELD | Admitting: Family Medicine

## 2019-03-13 ENCOUNTER — Other Ambulatory Visit: Payer: Self-pay | Admitting: Obstetrics & Gynecology

## 2019-03-13 DIAGNOSIS — Z1231 Encounter for screening mammogram for malignant neoplasm of breast: Secondary | ICD-10-CM

## 2019-04-13 ENCOUNTER — Encounter: Payer: Self-pay | Admitting: Family Medicine

## 2019-04-13 LAB — HM DIABETES EYE EXAM

## 2019-04-27 ENCOUNTER — Other Ambulatory Visit: Payer: Self-pay

## 2019-04-27 ENCOUNTER — Ambulatory Visit
Admission: RE | Admit: 2019-04-27 | Discharge: 2019-04-27 | Disposition: A | Discharge status: Home / Self Care | Payer: BLUE CROSS/BLUE SHIELD | Source: Ambulatory Visit | Attending: Obstetrics & Gynecology | Admitting: Obstetrics & Gynecology

## 2019-04-27 DIAGNOSIS — Z1231 Encounter for screening mammogram for malignant neoplasm of breast: Secondary | ICD-10-CM

## 2019-10-09 ENCOUNTER — Encounter: Payer: BLUE CROSS/BLUE SHIELD | Admitting: Family Medicine

## 2019-10-17 ENCOUNTER — Encounter: Payer: Self-pay | Admitting: Family Medicine

## 2019-10-17 ENCOUNTER — Other Ambulatory Visit: Payer: Self-pay

## 2019-10-17 ENCOUNTER — Ambulatory Visit (INDEPENDENT_AMBULATORY_CARE_PROVIDER_SITE_OTHER): Payer: BC Managed Care – PPO | Admitting: Family Medicine

## 2019-10-17 VITALS — BP 110/70 | HR 76 | Temp 97.6°F | Resp 18 | Ht 66.0 in | Wt 137.4 lb

## 2019-10-17 DIAGNOSIS — Z0000 Encounter for general adult medical examination without abnormal findings: Secondary | ICD-10-CM | POA: Diagnosis not present

## 2019-10-17 DIAGNOSIS — M8580 Other specified disorders of bone density and structure, unspecified site: Secondary | ICD-10-CM | POA: Diagnosis not present

## 2019-10-17 DIAGNOSIS — Z8249 Family history of ischemic heart disease and other diseases of the circulatory system: Secondary | ICD-10-CM | POA: Diagnosis not present

## 2019-10-17 LAB — CBC WITH DIFFERENTIAL/PLATELET
Basophils Absolute: 0 10*3/uL (ref 0.0–0.1)
Basophils Relative: 0.6 % (ref 0.0–3.0)
Eosinophils Absolute: 0.2 10*3/uL (ref 0.0–0.7)
Eosinophils Relative: 3.7 % (ref 0.0–5.0)
HCT: 36.5 % (ref 36.0–46.0)
Hemoglobin: 12.5 g/dL (ref 12.0–15.0)
Lymphocytes Relative: 34.3 % (ref 12.0–46.0)
Lymphs Abs: 1.8 10*3/uL (ref 0.7–4.0)
MCHC: 34.3 g/dL (ref 30.0–36.0)
MCV: 94.9 fl (ref 78.0–100.0)
Monocytes Absolute: 0.4 10*3/uL (ref 0.1–1.0)
Monocytes Relative: 8 % (ref 3.0–12.0)
Neutro Abs: 2.8 10*3/uL (ref 1.4–7.7)
Neutrophils Relative %: 53.4 % (ref 43.0–77.0)
Platelets: 308 10*3/uL (ref 150.0–400.0)
RBC: 3.84 Mil/uL — ABNORMAL LOW (ref 3.87–5.11)
RDW: 13.5 % (ref 11.5–15.5)
WBC: 5.2 10*3/uL (ref 4.0–10.5)

## 2019-10-17 LAB — COMPREHENSIVE METABOLIC PANEL
ALT: 16 U/L (ref 0–35)
AST: 20 U/L (ref 0–37)
Albumin: 4.2 g/dL (ref 3.5–5.2)
Alkaline Phosphatase: 59 U/L (ref 39–117)
BUN: 19 mg/dL (ref 6–23)
CO2: 29 mEq/L (ref 19–32)
Calcium: 9.1 mg/dL (ref 8.4–10.5)
Chloride: 103 mEq/L (ref 96–112)
Creatinine, Ser: 0.81 mg/dL (ref 0.40–1.20)
GFR: 71.09 mL/min (ref >60.00)
Glucose, Bld: 100 mg/dL — ABNORMAL HIGH (ref 70–99)
Potassium: 4.3 mEq/L (ref 3.5–5.1)
Sodium: 136 mEq/L (ref 135–145)
Total Bilirubin: 0.5 mg/dL (ref 0.2–1.2)
Total Protein: 6.2 g/dL (ref 6.0–8.3)

## 2019-10-17 LAB — VITAMIN D 25 HYDROXY (VIT D DEFICIENCY, FRACTURES): VITD: 76.69 ng/mL (ref 30.00–100.00)

## 2019-10-17 LAB — TSH: TSH: 3.81 u[IU]/mL (ref 0.35–4.50)

## 2019-10-17 LAB — LIPID PANEL
Cholesterol: 220 mg/dL — ABNORMAL HIGH (ref 0–200)
HDL: 74 mg/dL (ref >39.00)
LDL Cholesterol: 132 mg/dL — ABNORMAL HIGH (ref 0–99)
NonHDL: 145.58
Total CHOL/HDL Ratio: 3
Triglycerides: 66 mg/dL (ref 0.0–149.0)
VLDL: 13.2 mg/dL (ref 0.0–40.0)

## 2019-10-17 NOTE — Progress Notes (Signed)
Subjective:     Anita Galloway is a 64 y.o. female and is here for a comprehensive physical exam. The patient reports no problems.  Social History   Socioeconomic History  . Marital status: Married    Spouse name: Not on file  . Number of children: Not on file  . Years of education: Not on file  . Highest education level: Not on file  Occupational History  . 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 Smoking status: Former Games developer  . Smokeless tobacco: Never Used  . Tobacco comment: smoked in college  Substance and Sexual Activity  . Alcohol use: Yes    Alcohol/week: 7.0 - 14.0 standard drinks    Types: 7 - 14 Glasses of wine per week    Comment: 1-2 glasses wine per night per pt  . Drug use: No  . Sexual activity: Yes    Partners: Male  Other Topics Concern  . Not on file  Social History Narrative   Exercising-- treadmill, walk, elliptical   Social Determinants of Health   Financial Resource Strain:   . Difficulty of Paying Living Expenses:   Food Insecurity:   . Worried About Programme researcher, broadcasting/film/video in the Last Year:   . Barista in the Last Year:   Transportation Needs:   . Freight forwarder (Medical):   Marland Kitchen Lack of Transportation (Non-Medical):   Physical Activity:   . Days of Exercise per Week:   . Minutes of Exercise per Session:   Stress:   . Feeling of Stress :   Social Connections:   . Frequency of Communication with Friends and Family:   . Frequency of Social Gatherings with Friends and Family:   . Attends Religious Services:   . Active Member of Clubs or Organizations:   . Attends Banker Meetings:   Marland Kitchen Marital Status:   Intimate Partner Violence:   . Fear of Current or Ex-Partner:   . Emotionally Abused:   Marland Kitchen Physically Abused:   . Sexually Abused:    Health Maintenance  Topic Date Due  . INFLUENZA VACCINE  01/07/2020  . MAMMOGRAM  04/26/2020  . DEXA SCAN  04/12/2021  . PAP SMEAR-Modifier  04/12/2022  .  TETANUS/TDAP  10/10/2024  . COLONOSCOPY  06/20/2028  . COVID-19 Vaccine  Completed  . Hepatitis C Screening  Completed  . HIV Screening  Completed    The following portions of the patient's history were reviewed and updated as appropriate:  She  has a past medical history of Phlebitis. She does not have any pertinent problems on file. She  has a past surgical history that includes Cesarean section; Mass excision (02/23/2012); Dilation and curettage of uterus (11/2013); Mohs surgery (Left); and Colonoscopy (03/07/2008). Her family history includes Alzheimer's disease in her mother; Breast cancer in her mother and another family member; Cancer (age of onset: 63) in her mother; Coronary artery disease in an other family member; Diabetes in her father and another family member; Heart disease in her father; Mental retardation in her sister; Osteoporosis in her sister. She  reports that she has quit smoking. She has never used smokeless tobacco. She reports current alcohol use of about 7.0 - 14.0 standard drinks of alcohol per week. She reports that she does not use drugs. She has a current medication list which includes the following prescription(s): aspirin, biotin, calcium-vitamin d, cholecalciferol, melatonin, multivitamin, fish oil, prasterone, and zolpidem. Current Outpatient Medications on File Prior  to Visit  Medication Sig Dispense Refill  . aspirin 81 MG tablet Take 81 mg by mouth daily. Per pt every other day    . Biotin 16109 MCG TABS Take by mouth.    . Calcium Carbonate-Vitamin D (CALCIUM-VITAMIN D) 500-200 MG-UNIT per tablet Take 2 tablets by mouth daily.     . cholecalciferol (VITAMIN D) 1000 UNITS tablet Take 2,000 Units by mouth daily.     Marland Kitchen MELATONIN PO Take by mouth as needed.    . Multiple Vitamin (MULTIVITAMIN) tablet Take 1 tablet by mouth daily.    . Omega-3 Fatty Acids (FISH OIL) 1000 MG CAPS Take 1 capsule by mouth daily.    . Prasterone (INTRAROSA) 6.5 MG INST Place 1 Dose  vaginally daily.    Marland Kitchen zolpidem (AMBIEN) 5 MG tablet TAKE 1 TABLET BY MOUTH AT BEDTIME AS NEEDED 30 tablet 0   No current facility-administered medications on file prior to visit.   She has No Known Allergies..  Review of Systems Review of Systems  Constitutional: Negative for activity change, appetite change and fatigue.  HENT: Negative for hearing loss, congestion, tinnitus and ear discharge.  dentist q51m Eyes: Negative for visual disturbance (see optho q1y -- vision corrected to 20/20 with glasses).  Respiratory: Negative for cough, chest tightness and shortness of breath.   Cardiovascular: Negative for chest pain, palpitations and leg swelling.  Gastrointestinal: Negative for abdominal pain, diarrhea, constipation and abdominal distention.  Genitourinary: Negative for urgency, frequency, decreased urine volume and difficulty urinating.  Musculoskeletal: Negative for back pain, arthralgias and gait problem.  Skin: Negative for color change, pallor and rash.  Neurological: Negative for dizziness, light-headedness, numbness and headaches.  Hematological: Negative for adenopathy. Does not bruise/bleed easily.  Psychiatric/Behavioral: Negative for suicidal ideas, confusion, sleep disturbance, self-injury, dysphoric mood, decreased concentration and agitation.       Objective:    BP 110/70 (BP Location: Left Arm, Patient Position: Sitting, Cuff Size: Normal)   Pulse 76   Temp 97.6 F (36.4 C) (Temporal)   Resp 18   Ht 5\' 6"  (1.676 m)   Wt 137 lb 6.4 oz (62.3 kg)   SpO2 97%   BMI 22.18 kg/m  General appearance: alert, cooperative, appears stated age and no distress Head: Normocephalic, without obvious abnormality, atraumatic Eyes: conjunctivae/corneas clear. PERRL, EOM's intact. Fundi benign. Ears: normal TM's and external ear canals both ears Nose: Nares normal. Septum midline. Mucosa normal. No drainage or sinus tenderness. Throat: lips, mucosa, and tongue normal; teeth and  gums normal Neck: no adenopathy, no carotid bruit, no JVD, supple, symmetrical, trachea midline and thyroid not enlarged, symmetric, no tenderness/mass/nodules Back: symmetric, no curvature. ROM normal. No CVA tenderness. Lungs: clear to auscultation bilaterally Breasts: normal appearance, no masses or tenderness Heart: regular rate and rhythm, S1, S2 normal, no murmur, click, rub or gallop Abdomen: soft, non-tender; bowel sounds normal; no masses,  no organomegaly Pelvic: not indicated; post-menopausal, no abnormal Pap smears in past Extremities: extremities normal, atraumatic, no cyanosis or edema Pulses: 2+ and symmetric Skin: Skin color, texture, turgor normal. No rashes or lesions Lymph nodes: Cervical, supraclavicular, and axillary nodes normal. Neurologic: Alert and oriented X 3, normal strength and tone. Normal symmetric reflexes. Normal coordination and gait    Assessment:    Healthy female exam.      Plan:    ghm utd Check labs  See After Visit Summary for Counseling Recommendations    1. Osteopenia, unspecified location bmd reviewed from ob gyn--- scanned into  chart  - Comprehensive metabolic panel - Vitamin D (25 hydroxy)  2. Preventative health care See above  - Lipid panel - CBC with Differential/Platelet - TSH - Comprehensive metabolic panel - Vitamin D (25 hydroxy)  3. Family history of early CAD ekg nsr  - EKG 12-Lead

## 2019-10-17 NOTE — Patient Instructions (Signed)

## 2019-10-18 ENCOUNTER — Other Ambulatory Visit: Payer: Self-pay | Admitting: Family Medicine

## 2019-10-18 DIAGNOSIS — E785 Hyperlipidemia, unspecified: Secondary | ICD-10-CM

## 2020-03-25 ENCOUNTER — Other Ambulatory Visit: Payer: Self-pay | Admitting: Obstetrics & Gynecology

## 2020-03-25 DIAGNOSIS — Z1231 Encounter for screening mammogram for malignant neoplasm of breast: Secondary | ICD-10-CM

## 2020-04-11 ENCOUNTER — Other Ambulatory Visit: Payer: Self-pay | Admitting: Family Medicine

## 2020-04-11 NOTE — Telephone Encounter (Signed)
Requesting: Ambien 5mg  Contract: None UDS: None Last Visit: 10/17/2019 Next Visit: 10/17/2020 Last Refill: 09/06/2018 #30 and 0RF  Please Advise

## 2020-05-07 ENCOUNTER — Ambulatory Visit
Admission: RE | Admit: 2020-05-07 | Discharge: 2020-05-07 | Disposition: A | Discharge status: Home / Self Care | Payer: BC Managed Care – PPO | Source: Ambulatory Visit | Attending: Obstetrics & Gynecology | Admitting: Obstetrics & Gynecology

## 2020-05-07 ENCOUNTER — Other Ambulatory Visit: Payer: Self-pay

## 2020-05-07 DIAGNOSIS — Z1231 Encounter for screening mammogram for malignant neoplasm of breast: Secondary | ICD-10-CM

## 2020-05-28 ENCOUNTER — Encounter: Payer: Self-pay | Admitting: Family Medicine

## 2020-05-28 ENCOUNTER — Telehealth: Payer: Self-pay | Admitting: Family Medicine

## 2020-05-28 NOTE — Telephone Encounter (Signed)
Patient did not give any more information, just that she wanted to speak with you

## 2020-05-28 NOTE — Telephone Encounter (Signed)
Patient is requesting a call back from CMA.

## 2020-05-28 NOTE — Telephone Encounter (Signed)
Any more information?

## 2020-05-29 NOTE — Telephone Encounter (Signed)
Spoke with patient. Pt states she will do a home test in a few days.

## 2020-10-17 ENCOUNTER — Ambulatory Visit: Payer: Medicare Other | Admitting: Family Medicine

## 2020-10-29 ENCOUNTER — Other Ambulatory Visit: Payer: Self-pay

## 2020-10-29 ENCOUNTER — Ambulatory Visit (INDEPENDENT_AMBULATORY_CARE_PROVIDER_SITE_OTHER): Payer: Medicare Other | Admitting: Family Medicine

## 2020-10-29 VITALS — BP 130/86 | HR 65 | Temp 97.7°F | Resp 18 | Ht 66.0 in | Wt 147.9 lb

## 2020-10-29 DIAGNOSIS — Z23 Encounter for immunization: Secondary | ICD-10-CM

## 2020-10-29 DIAGNOSIS — E559 Vitamin D deficiency, unspecified: Secondary | ICD-10-CM

## 2020-10-29 DIAGNOSIS — Z0000 Encounter for general adult medical examination without abnormal findings: Secondary | ICD-10-CM | POA: Diagnosis not present

## 2020-10-29 DIAGNOSIS — Z136 Encounter for screening for cardiovascular disorders: Secondary | ICD-10-CM | POA: Diagnosis not present

## 2020-10-29 DIAGNOSIS — I998 Other disorder of circulatory system: Secondary | ICD-10-CM | POA: Diagnosis not present

## 2020-10-29 DIAGNOSIS — E785 Hyperlipidemia, unspecified: Secondary | ICD-10-CM | POA: Diagnosis not present

## 2020-10-29 NOTE — Progress Notes (Addendum)
Subjective:    Anita Galloway is a 65 y.o. female who presents for a Welcome to Medicare exam.   Review of Systems  Review of Systems  Constitutional: Negative for activity change, appetite change and fatigue.  HENT: Negative for hearing loss, congestion, tinnitus and ear discharge.   Eyes: Negative for visual disturbance (see optho q1y -- vision corrected to 20/20 with glasses).  Respiratory: Negative for cough, chest tightness and shortness of breath.   Cardiovascular: Negative for chest pain, palpitations and leg swelling.  Gastrointestinal: Negative for abdominal pain, diarrhea, constipation and abdominal distention.  Genitourinary: Negative for urgency, frequency, decreased urine volume and difficulty urinating.  Musculoskeletal: Negative for back pain, arthralgias and gait problem.  Skin: Negative for color change, pallor and rash.  Neurological: Negative for dizziness, light-headedness, numbness and headaches.  Hematological: Negative for adenopathy. Does not bruise/bleed easily.  Psychiatric/Behavioral: Negative for suicidal ideas, confusion, sleep disturbance, self-injury, dysphoric mood, decreased concentration and agitation.  Pt is able to read and write and can do all ADLs No risk for falling No abuse/ violence in home          Objective:    Today's Vitals   10/29/20 1551  BP: 130/86  Pulse: 65  Resp: 18  Temp: 97.7 F (36.5 C)  TempSrc: Oral  SpO2: 98%  Weight: 147 lb 14.4 oz (67.1 kg)  Height: 5\' 6"  (1.676 m)  Body mass index is 23.87 kg/m.  Medications Outpatient Encounter Medications as of 10/29/2020  Medication Sig  . aspirin 81 MG tablet Take 81 mg by mouth daily. Per pt every other day  . Biotin 10/31/2020 MCG TABS Take by mouth.  . Calcium Carbonate-Vitamin D (CALCIUM-VITAMIN D) 500-200 MG-UNIT per tablet Take 2 tablets by mouth daily.   . cholecalciferol (VITAMIN D) 1000 UNITS tablet Take 2,000 Units by mouth daily.   69678 estradiol (ESTRACE)  0.1 MG/GM vaginal cream Place vaginally.  Marland Kitchen MELATONIN PO Take by mouth as needed.  . Multiple Vitamin (MULTIVITAMIN) tablet Take 1 tablet by mouth daily.  . Omega-3 Fatty Acids (FISH OIL) 1000 MG CAPS Take 1 capsule by mouth daily.  Marland Kitchen zolpidem (AMBIEN) 5 MG tablet TAKE 1 TABLET BY MOUTH EVERY DAY AT BEDTIME AS NEEDED  . [DISCONTINUED] Prasterone (INTRAROSA) 6.5 MG INST Place 1 Dose vaginally daily.   No facility-administered encounter medications on file as of 10/29/2020.     History: Past Medical History:  Diagnosis Date  . Phlebitis    after C-section---left leg   Past Surgical History:  Procedure Laterality Date  . CESAREAN SECTION     x's 2  . COLONOSCOPY  03/07/2008   w/Dr.Brodie  . DILATION AND CURETTAGE OF UTERUS  11/2013  . MASS EXCISION  02/23/2012   Procedure: MINOR EXCISION OF MASS;  Surgeon: 02/25/2012., MD;  Location: [facility];  Service: Orthopedics;  Laterality: Left;  excision cyst left long finger  . MOHS SURGERY Left    08/2016  . r 5th metacarpal Right 02/2019   gary kuzma     Family History  Problem Relation Age of Onset  . Alzheimer's disease Mother   . Cancer Mother 88       BREAST CA  . Breast cancer Mother   . Osteoporosis Sister   . Mental retardation Sister   . Diabetes Father   . Heart disease Father        CAD, CHF, CABG  . Breast cancer Other   . Diabetes  Other   . Coronary artery disease Other   . Colon cancer Neg Hx   . Colon polyps Neg Hx   . Esophageal cancer Neg Hx   . Rectal cancer Neg Hx   . Stomach cancer Neg Hx    Social History   Occupational History  . 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 Smoking status: Former Games developer  . Smokeless tobacco: Never Used  . Tobacco comment: smoked in college  Vaping Use  . Vaping Use: Never used  Substance and Sexual Activity  . Alcohol use: Yes    Alcohol/week: 7.0 - 14.0 standard drinks    Types: 7 - 14 Glasses of wine per week    Comment:  1-2 glasses wine per night per pt  . Drug use: No  . Sexual activity: Yes    Partners: Male    Tobacco Counseling Counseling given: Not Answered Comment: smoked in college   Immunizations and Health Maintenance Immunization History  Administered Date(s) Administered  . Influenza-Unspecified 03/25/2018  . Moderna Sars-Covid-2 Vaccination 09/06/2019, 10/06/2019, 06/02/2020  . PNEUMOCOCCAL CONJUGATE-20 10/29/2020  . Td 09/05/2004  . Tdap 10/11/2014  . Zoster 10/15/2015  . Zoster Recombinat (Shingrix) 11/21/2017, 03/25/2018   Health Maintenance Due  Topic Date Due  . PNA vac Low Risk Adult (1 of 2 - PCV13) 05/17/2020   Health Maintenance Due  Topic Date Due  . Zoster Vaccines- Shingrix (1 of 2) 05/17/2005  . PNA vac Low Risk Adult (1 of 2 - PCV13) 05/17/2020     Activities of Daily Living In your present state of health, do you have any difficulty performing the following activities: 10/29/2020 10/29/2020  Hearing? N N  Vision? N N  Difficulty concentrating or making decisions? N N  Walking or climbing stairs? N N  Dressing or bathing? N N  Doing errands, shopping? N N  Some recent data might be hidden    Physical Exam  BP 130/86 (BP Location: Right Arm, Patient Position: Sitting, Cuff Size: Normal)   Pulse 65   Temp 97.7 F (36.5 C) (Oral)   Resp 18   Ht 5\' 6"  (1.676 m)   Wt 147 lb 14.4 oz (67.1 kg)   SpO2 98%   BMI 23.87 kg/m  General appearance: alert, cooperative, appears stated age and no distress Head: Normocephalic, without obvious abnormality, atraumatic Eyes: negative findings: lids and lashes normal and conjunctivae and sclerae normal Ears: normal TM's and external ear canals both ears Neck: no adenopathy, no carotid bruit, no JVD, supple, symmetrical, trachea midline and thyroid not enlarged, symmetric, no tenderness/mass/nodules Back: symmetric, no curvature. ROM normal. No CVA tenderness. Lungs: clear to auscultation bilaterally Breasts: gyn Heart:  regular rate and rhythm, S1, S2 normal, no murmur, click, rub or gallop Abdomen: soft, non-tender; bowel sounds normal; no masses,  no organomegaly Pelvic: deferred-- gyn Extremities: extremities normal, atraumatic, no cyanosis or edema Pulses: 2+ and symmetric Skin: Skin color, texture, turgor normal. No rashes or lesions Lymph nodes: Cervical, supraclavicular, and axillary nodes normal. Neurologic: Alert and oriented X 3, normal strength and tone. Normal symmetric reflexes. Normal coordination and gait(optional), or other factors deemed appropriate based on the beneficiary's medical and social history and current clinical standards.    ekg-- sinus ,  Neg T waves--  No change from preveious--10/2019    Assessment:    This is a routine wellness examination for this patient .   Vision/Hearing screen  Hearing Screening   125Hz  250Hz  500Hz  1000Hz  2000Hz   3000Hz  4000Hz  6000Hz  8000Hz   Right ear:           Left ear:           Comments: Normal whisper    Visual Acuity Screening   Right eye Left eye Both eyes  Without correction: 20/20 20/20 20/20   With correction:       Dietary issues and exercise activities discussed:  Current Exercise Habits: Structured exercise class, Type of exercise: walking;strength training/weights, Time (Minutes): 60, Frequency (Times/Week): 7, Weekly Exercise (Minutes/Week): 420, Intensity: Moderate, Exercise limited by: None identified  Goals   None    Depression Screen PHQ 2/9 Scores 10/29/2020 10/17/2019 10/19/2017 10/15/2015  PHQ - 2 Score 0 0 0 0     Fall Risk Fall Risk  10/29/2020  Falls in the past year? 0  Number falls in past yr: 0  Injury with Fall? 0  Follow up Falls evaluation completed    Cognitive Function: MMSE - Mini Mental State Exam 10/29/2020  Orientation to time 5  Orientation to Place 5  Registration 3  Attention/ Calculation 5  Recall 3  Language- name 2 objects 2  Language- repeat 1  Language- follow 3 step command 3   Language- read & follow direction 1  Write a sentence 1  Copy design 1  Total score 30        Patient Care Team: 12/17/2019, DO as PCP - General 10/21/2017, Amy, MD as Consulting Physician (Dermatology) 12/15/2015, MD as Consulting Physician (Obstetrics and Gynecology) 10/31/2020, OD (Optometry) 10/31/2020, MD as Consulting Physician (Orthopedic Surgery) Donato Schultz, [place], DMD as Consulting Physician (Dentistry)     Plan:    ghm utd  Check labs  rto 1 year   I have personally reviewed and noted the following in the patient's chart:   . Medical and social history . Use of alcohol, tobacco or illicit drugs  . Current medications and supplements . Functional ability and status . Nutritional status . Physical activity . Advanced directives . List of other physicians . Hospitalizations, surgeries, and ER visits in previous 12 months . Vitals . Screenings to include cognitive, depression, and falls . Referrals and appointments  In addition, I have reviewed and discussed with patient certain preventive protocols, quality metrics, and best practice recommendations. A written personalized care plan for preventive services as well as general preventive health recommendations were provided to patient.     Freddy Finner Chase, DO 10/30/2020

## 2020-10-29 NOTE — Patient Instructions (Signed)
Preventive Care 66 Years and Older, Female Preventive care refers to lifestyle choices and visits with your health care provider that can promote health and wellness. This includes:  A yearly physical exam. This is also called an annual wellness visit.  Regular dental and eye exams.  Immunizations.  Screening for certain conditions.  Healthy lifestyle choices, such as: ? Eating a healthy diet. ? Getting regular exercise. ? Not using drugs or products that contain nicotine and tobacco. ? Limiting alcohol use. What can I expect for my preventive care visit? Physical exam Your health care provider will check your:  Height and weight. These may be used to calculate your BMI (body mass index). BMI is a measurement that tells if you are at a healthy weight.  Heart rate and blood pressure.  Body temperature.  Skin for abnormal spots. Counseling Your health care provider may ask you questions about your:  Past medical problems.  Family's medical history.  Alcohol, tobacco, and drug use.  Emotional well-being.  Home life and relationship well-being.  Sexual activity.  Diet, exercise, and sleep habits.  History of falls.  Memory and ability to understand (cognition).  Work and work Statistician.  Pregnancy and menstrual history.  Access to firearms. What immunizations do I need? Vaccines are usually given at various ages, according to a schedule. Your health care provider will recommend vaccines for you based on your age, medical history, and lifestyle or other factors, such as travel or where you work.   What tests do I need? Blood tests  Lipid and cholesterol levels. These may be checked every 5 years, or more often depending on your overall health.  Hepatitis C test.  Hepatitis B test. Screening  Lung cancer screening. You may have this screening every year starting at age 66 if you have a 30-pack-year history of smoking and currently smoke or have quit within  the past 15 years.  Colorectal cancer screening. ? All adults should have this screening starting at age 66 and continuing until age 66. ? Your health care provider may recommend screening at age 66 if you are at increased risk. ? You will have tests every 1-10 years, depending on your results and the type of screening test.  Diabetes screening. ? This is done by checking your blood sugar (glucose) after you have not eaten for a while (fasting). ? You may have this done every 1-3 years.  Mammogram. ? This may be done every 1-2 years. ? Talk with your health care provider about how often you should have regular mammograms.  Abdominal aortic aneurysm (AAA) screening. You may need this if you are a current or former smoker.  BRCA-related cancer screening. This may be done if you have a family history of breast, ovarian, tubal, or peritoneal cancers. Other tests  STD (sexually transmitted disease) testing, if you are at risk.  Bone density scan. This is done to screen for osteoporosis. You may have this done starting at age 66. Talk with your health care provider about your test results, treatment options, and if necessary, the need for more tests. Follow these instructions at home: Eating and drinking  Eat a diet that includes fresh fruits and vegetables, whole grains, lean protein, and low-fat dairy products. Limit your intake of foods with high amounts of sugar, saturated fats, and salt.  Take vitamin and mineral supplements as recommended by your health care provider.  Do not drink alcohol if your health care provider tells you not to drink.  If you drink alcohol: ? Limit how much you have to 0-1 drink a day. ? Be aware of how much alcohol is in your drink. In the [place] one drink equals one 12 oz bottle of beer (355 mL), one 5 oz glass of wine (148 mL), or one 1 oz glass of hard liquor (44 mL).   Lifestyle  Take daily care of your teeth and gums. Brush your teeth every morning  and night with fluoride toothpaste. Floss one time each day.  Stay active. Exercise for at least 30 minutes 5 or more days each week.  Do not use any products that contain nicotine or tobacco, such as cigarettes, e-cigarettes, and chewing tobacco. If you need help quitting, ask your health care provider.  Do not use drugs.  If you are sexually active, practice safe sex. Use a condom or other form of protection in order to prevent STIs (sexually transmitted infections).  Talk with your health care provider about taking a low-dose aspirin or statin.  Find healthy ways to cope with stress, such as: ? Meditation, yoga, or listening to music. ? Journaling. ? Talking to a trusted person. ? Spending time with friends and family. Safety  Always wear your seat belt while driving or riding in a vehicle.  Do not drive: ? If you have been drinking alcohol. Do not ride with someone who has been drinking. ? When you are tired or distracted. ? While texting.  Wear a helmet and other protective equipment during sports activities.  If you have firearms in your house, make sure you follow all gun safety procedures. What's next?  Visit your health care provider once a year for an annual wellness visit.  Ask your health care provider how often you should have your eyes and teeth checked.  Stay up to date on all vaccines. This information is not intended to replace advice given to you by your health care provider. Make sure you discuss any questions you have with your health care provider. Document Revised: 05/15/2020 Document Reviewed: 05/19/2018 Elsevier Patient Education  2021 Elsevier Inc.  

## 2020-10-30 ENCOUNTER — Encounter: Payer: Self-pay | Admitting: Family Medicine

## 2020-10-30 LAB — CBC WITH DIFFERENTIAL/PLATELET
Basophils Absolute: 0 10*3/uL (ref 0.0–0.1)
Basophils Relative: 0.6 % (ref 0.0–3.0)
Eosinophils Absolute: 0.2 10*3/uL (ref 0.0–0.7)
Eosinophils Relative: 3.2 % (ref 0.0–5.0)
HCT: 36.7 % (ref 36.0–46.0)
Hemoglobin: 12.4 g/dL (ref 12.0–15.0)
Lymphocytes Relative: 33.3 % (ref 12.0–46.0)
Lymphs Abs: 2.3 10*3/uL (ref 0.7–4.0)
MCHC: 33.6 g/dL (ref 30.0–36.0)
MCV: 95.3 fl (ref 78.0–100.0)
Monocytes Absolute: 0.5 10*3/uL (ref 0.1–1.0)
Monocytes Relative: 7.3 % (ref 3.0–12.0)
Neutro Abs: 3.9 10*3/uL (ref 1.4–7.7)
Neutrophils Relative %: 55.6 % (ref 43.0–77.0)
Platelets: 292 10*3/uL (ref 150.0–400.0)
RBC: 3.85 Mil/uL — ABNORMAL LOW (ref 3.87–5.11)
RDW: 13.9 % (ref 11.5–15.5)
WBC: 6.9 10*3/uL (ref 4.0–10.5)

## 2020-10-30 LAB — COMPREHENSIVE METABOLIC PANEL
ALT: 16 U/L (ref 0–35)
AST: 19 U/L (ref 0–37)
Albumin: 4.4 g/dL (ref 3.5–5.2)
Alkaline Phosphatase: 58 U/L (ref 39–117)
BUN: 23 mg/dL (ref 6–23)
CO2: 28 mEq/L (ref 19–32)
Calcium: 9.8 mg/dL (ref 8.4–10.5)
Chloride: 103 mEq/L (ref 96–112)
Creatinine, Ser: 0.8 mg/dL (ref 0.40–1.20)
GFR: 77.3 mL/min (ref >60.00)
Glucose, Bld: 90 mg/dL (ref 70–99)
Potassium: 3.9 mEq/L (ref 3.5–5.1)
Sodium: 139 mEq/L (ref 135–145)
Total Bilirubin: 0.5 mg/dL (ref 0.2–1.2)
Total Protein: 6.8 g/dL (ref 6.0–8.3)

## 2020-10-30 LAB — VITAMIN D 25 HYDROXY (VIT D DEFICIENCY, FRACTURES): VITD: 65.57 ng/mL (ref 30.00–100.00)

## 2020-10-30 LAB — LIPID PANEL
Cholesterol: 242 mg/dL — ABNORMAL HIGH (ref 0–200)
HDL: 81.4 mg/dL (ref >39.00)
LDL Cholesterol: 139 mg/dL — ABNORMAL HIGH (ref 0–99)
NonHDL: 160.76
Total CHOL/HDL Ratio: 3
Triglycerides: 110 mg/dL (ref 0.0–149.0)
VLDL: 22 mg/dL (ref 0.0–40.0)

## 2021-04-08 ENCOUNTER — Other Ambulatory Visit: Payer: Self-pay | Admitting: Obstetrics & Gynecology

## 2021-04-08 DIAGNOSIS — Z1231 Encounter for screening mammogram for malignant neoplasm of breast: Secondary | ICD-10-CM

## 2021-05-14 ENCOUNTER — Ambulatory Visit
Admission: RE | Admit: 2021-05-14 | Discharge: 2021-05-14 | Disposition: A | Discharge status: Home / Self Care | Payer: Medicare Other | Source: Ambulatory Visit | Attending: Obstetrics & Gynecology | Admitting: Obstetrics & Gynecology

## 2021-05-14 DIAGNOSIS — Z1231 Encounter for screening mammogram for malignant neoplasm of breast: Secondary | ICD-10-CM

## 2021-09-23 ENCOUNTER — Other Ambulatory Visit: Payer: Self-pay | Admitting: Family Medicine

## 2021-09-24 NOTE — Telephone Encounter (Signed)
Requesting: Ambien 5mg   ?Contract: None ?UDS: None ?Last Visit: 10/29/20 ?Next Visit: 10/17/21 ?Last Refill: 04/11/20 #30 and 0RF ? ?Please Advise ? ?

## 2021-10-06 ENCOUNTER — Encounter: Payer: Medicare Other | Admitting: Family Medicine

## 2021-10-17 ENCOUNTER — Encounter: Payer: Self-pay | Admitting: Family Medicine

## 2021-10-17 ENCOUNTER — Ambulatory Visit (INDEPENDENT_AMBULATORY_CARE_PROVIDER_SITE_OTHER): Payer: Medicare Other | Admitting: Family Medicine

## 2021-10-17 VITALS — BP 120/82 | HR 74 | Temp 97.5°F | Resp 16 | Ht 66.0 in | Wt 144.6 lb

## 2021-10-17 DIAGNOSIS — E559 Vitamin D deficiency, unspecified: Secondary | ICD-10-CM | POA: Diagnosis not present

## 2021-10-17 DIAGNOSIS — E785 Hyperlipidemia, unspecified: Secondary | ICD-10-CM | POA: Diagnosis not present

## 2021-10-17 LAB — CBC WITH DIFFERENTIAL/PLATELET
Basophils Absolute: 0 10*3/uL (ref 0.0–0.1)
Basophils Relative: 0.5 % (ref 0.0–3.0)
Eosinophils Absolute: 0.2 10*3/uL (ref 0.0–0.7)
Eosinophils Relative: 3.4 % (ref 0.0–5.0)
HCT: 38.8 % (ref 36.0–46.0)
Hemoglobin: 13.1 g/dL (ref 12.0–15.0)
Lymphocytes Relative: 29.7 % (ref 12.0–46.0)
Lymphs Abs: 1.6 10*3/uL (ref 0.7–4.0)
MCHC: 33.9 g/dL (ref 30.0–36.0)
MCV: 97.3 fl (ref 78.0–100.0)
Monocytes Absolute: 0.5 10*3/uL (ref 0.1–1.0)
Monocytes Relative: 8.4 % (ref 3.0–12.0)
Neutro Abs: 3.2 10*3/uL (ref 1.4–7.7)
Neutrophils Relative %: 58 % (ref 43.0–77.0)
Platelets: 282 10*3/uL (ref 150.0–400.0)
RBC: 3.98 Mil/uL (ref 3.87–5.11)
RDW: 13.4 % (ref 11.5–15.5)
WBC: 5.4 10*3/uL (ref 4.0–10.5)

## 2021-10-17 LAB — LIPID PANEL
Cholesterol: 232 mg/dL — ABNORMAL HIGH (ref 0–200)
HDL: 89.5 mg/dL (ref >39.00)
LDL Cholesterol: 131 mg/dL — ABNORMAL HIGH (ref 0–99)
NonHDL: 142.1
Total CHOL/HDL Ratio: 3
Triglycerides: 57 mg/dL (ref 0.0–149.0)
VLDL: 11.4 mg/dL (ref 0.0–40.0)

## 2021-10-17 LAB — COMPREHENSIVE METABOLIC PANEL
ALT: 15 U/L (ref 0–35)
AST: 21 U/L (ref 0–37)
Albumin: 4.5 g/dL (ref 3.5–5.2)
Alkaline Phosphatase: 55 U/L (ref 39–117)
BUN: 21 mg/dL (ref 6–23)
CO2: 27 mEq/L (ref 19–32)
Calcium: 9.5 mg/dL (ref 8.4–10.5)
Chloride: 102 mEq/L (ref 96–112)
Creatinine, Ser: 0.95 mg/dL (ref 0.40–1.20)
GFR: 62.47 mL/min (ref >60.00)
Glucose, Bld: 91 mg/dL (ref 70–99)
Potassium: 4.5 mEq/L (ref 3.5–5.1)
Sodium: 138 mEq/L (ref 135–145)
Total Bilirubin: 0.6 mg/dL (ref 0.2–1.2)
Total Protein: 6.6 g/dL (ref 6.0–8.3)

## 2021-10-17 LAB — VITAMIN D 25 HYDROXY (VIT D DEFICIENCY, FRACTURES): VITD: 73.8 ng/mL (ref 30.00–100.00)

## 2021-10-17 NOTE — Assessment & Plan Note (Signed)
Encourage heart healthy diet such as MIND or DASH diet, increase exercise, avoid trans fats, simple carbohydrates and processed foods, consider a krill or fish or flaxseed oil cap daily.  °

## 2021-10-17 NOTE — Patient Instructions (Signed)
Fatigue If you have fatigue, you feel tired all the time and have a lack of energy or a lack of motivation. Fatigue may make it difficult to start or complete tasks because of exhaustion. Occasional or mild fatigue is often a normal response to activity or life. However, long-term (chronic) or extreme fatigue may be a symptom of a medical condition such as: Depression. Not having enough red blood cells or hemoglobin in the blood (anemia). A problem with a small gland located in the lower front part of the neck (thyroid disorder). Rheumatologic conditions. These are problems related to the body's defense system (immune system). Infections, especially certain viral infections. Fatigue can also lead to negative health outcomes over time. Follow these instructions at home: Medicines Take over-the-counter and prescription medicines only as told by your health care provider. Take a multivitamin if told by your health care provider. Do not use herbal or dietary supplements unless they are approved by your health care provider. Eating and drinking  Avoid heavy meals in the evening. Eat a well-balanced diet, which includes lean proteins, whole grains, plenty of fruits and vegetables, and low-fat dairy products. Avoid eating or drinking too many products with caffeine in them. Avoid alcohol. Drink enough fluid to keep your urine pale yellow. Activity  Exercise regularly, as told by your health care provider. Use or practice techniques to help you relax, such as yoga, tai chi, meditation, or massage therapy. Lifestyle Change situations that cause you stress. Try to keep your work and personal schedules in balance. Do not use recreational or illegal drugs. General instructions Monitor your fatigue for any changes. Go to bed and get up at the same time every day. Avoid fatigue by pacing yourself during the day and getting enough sleep at night. Maintain a healthy weight. Contact a health care  provider if: Your fatigue does not get better. You have a fever. You suddenly lose or gain weight. You have headaches. You have trouble falling asleep or sleeping through the night. You feel angry, guilty, anxious, or sad. You have swelling in your legs or another part of your body. Get help right away if: You feel confused, feel like you might faint, or faint. Your vision is blurry or you have a severe headache. You have severe pain in your abdomen, your back, or the area between your waist and hips (pelvis). You have chest pain, shortness of breath, or an irregular or fast heartbeat. You are unable to urinate, or you urinate less than normal. You have abnormal bleeding from the rectum, nose, lungs, nipples, or, if you are female, the vagina. You vomit blood. You have thoughts about hurting yourself or others. These symptoms may be an emergency. Get help right away. Call 911. Do not wait to see if the symptoms will go away. Do not drive yourself to the hospital. Get help right away if you feel like you may hurt yourself or others, or have thoughts about taking your own life. Go to your nearest emergency room or: Call 911. Call the National Suicide Prevention Lifeline at 1-800-273-8255 or 988. This is open 24 hours a day. Text the Crisis Text Line at 741741. Summary If you have fatigue, you feel tired all the time and have a lack of energy or a lack of motivation. Fatigue may make it difficult to start or complete tasks because of exhaustion. Long-term (chronic) or extreme fatigue may be a symptom of a medical condition. Exercise regularly, as told by your health care provider.   Change situations that cause you stress. Try to keep your work and personal schedules in balance. This information is not intended to replace advice given to you by your health care provider. Make sure you discuss any questions you have with your health care provider. Document Revised: 03/17/2021 Document  Reviewed: 03/17/2021 Elsevier Patient Education  2023 Elsevier Inc.  

## 2021-10-17 NOTE — Assessment & Plan Note (Signed)
Check labs 

## 2021-10-17 NOTE — Progress Notes (Signed)
? ?Subjective:  ? ?By signing my name below, I, Zite Okoli, attest that this documentation has been prepared under the direction and in the presence of Donato Schultz, DO. 10/17/2021 ? ? Patient ID: Anita Galloway, female    DOB: [birth year], 66 y.o.   MRN: [id number] ? ?Chief Complaint  ?Patient presents with  ? Hyperlipidemia  ? Follow-up  ? ? ?HPI ?Patient is in today for an office visit and f/u. She is doing well at this time. ? ?She complains of hip pain, right being worse than left that is worse and stiff in the morning. She takes ibuprofen and it causes great relief. She will be seeing orthopedics in June. ? ?She will like to stop taking 81 mg aspirin because she was told it was not needed.  ? ?She complains of split and weak nails that are slow-growing. ? ?She has a family history of Alzheimer's disease and is worried about the onset because she forgets names and things. She wonders if she can start prevagen.  ? ?She was screened for cancer and found to be negative. ? ?Her OBGYN recently retired.  ? ?She is exercising regularly with water aerobics and the treadmill. ? ?She is UTD on pneumonia and shingles vaccines. She has 3 Covid-19 vaccines at this time.  ? ? ? ?Past Medical History:  ?Diagnosis Date  ? Phlebitis   ? after C-section---left leg  ? ? ?Past Surgical History:  ?Procedure Laterality Date  ? CESAREAN SECTION    ? x's 2  ? COLONOSCOPY  03/07/2008  ? w/Dr.Brodie  ? DILATION AND CURETTAGE OF UTERUS  11/2013  ? MASS EXCISION  02/23/2012  ? Procedure: MINOR EXCISION OF MASS;  Surgeon: Wyn Forster., MD;  Location: [facility];  Service: Orthopedics;  Laterality: Left;  excision cyst left long finger  ? MOHS SURGERY Left   ? 08/2016  ? r 5th metacarpal Right 02/2019  ? gary kuzma   ? ? ?Family History  ?Problem Relation Age of Onset  ? Alzheimer's disease Mother   ? Cancer Mother 10  ?     BREAST CA  ? Breast cancer Mother 43  ? Diabetes Father   ? Heart disease Father    ?     CAD, CHF, CABG  ? Osteoporosis Sister   ? Mental retardation Sister   ? Breast cancer Other   ? Diabetes Other   ? Coronary artery disease Other   ? Colon cancer Neg Hx   ? Colon polyps Neg Hx   ? Esophageal cancer Neg Hx   ? Rectal cancer Neg Hx   ? Stomach cancer Neg Hx   ? ? ?Social History  ? ?Socioeconomic History  ? Marital status: Married  ?  Spouse name: Not on file  ? Number of children: Not on file  ? Years of education: Not on file  ? Highest education level: Not on file  ?Occupational History  ? 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 Smoking status: Former  ? Smokeless tobacco: Never  ? Tobacco comments:  ?  smoked in college  ?Vaping Use  ? Vaping Use: Never used  ?Substance and Sexual Activity  ? Alcohol use: Yes  ?  Alcohol/week: 7.0 - 14.0 standard drinks  ?  Types: 7 - 14 Glasses of wine per week  ?  Comment: 1-2 glasses wine per night per pt  ? Drug use: No  ? Sexual activity: Yes  ?  Partners: Male  ?Other Topics Concern  ? Not on file  ?Social History Narrative  ? Exercising-- treadmill, walk, elliptical  ? ?Social Determinants of Health  ? ?Financial Resource Strain: Not on file  ?Food Insecurity: Not on file  ?Transportation Needs: Not on file  ?Physical Activity: Not on file  ?Stress: Not on file  ?Social Connections: Not on file  ?Intimate Partner Violence: Not on file  ? ? ?Outpatient Medications Prior to Visit  ?Medication Sig Dispense Refill  ? Biotin 18299 MCG TABS Take by mouth.    ? Calcium Carbonate-Vitamin D (CALCIUM-VITAMIN D) 500-200 MG-UNIT per tablet Take 2 tablets by mouth daily.     ? cholecalciferol (VITAMIN D) 1000 UNITS tablet Take 2,000 Units by mouth daily.     ? estradiol (ESTRACE) 0.1 MG/GM vaginal cream Place vaginally.    ? MELATONIN PO Take by mouth as needed.    ? Multiple Vitamin (MULTIVITAMIN) tablet Take 1 tablet by mouth daily.    ? Omega-3 Fatty Acids (FISH OIL) 1000 MG CAPS Take 1 capsule by mouth daily.    ? zolpidem (AMBIEN) 5 MG  tablet TAKE 1 TABLET BY MOUTH EVERY DAY AT BEDTIME AS NEEDED 30 tablet 0  ? aspirin 81 MG tablet Take 81 mg by mouth daily. Per pt every other day    ? ?No facility-administered medications prior to visit.  ? ? ?No Known Allergies ? ?Review of Systems  ?Constitutional:  Negative for fever.  ?HENT:  Negative for congestion, ear pain, hearing loss, sinus pain and sore throat.   ?Eyes:  Negative for blurred vision and pain.  ?Respiratory:  Negative for cough, sputum production, shortness of breath and wheezing.   ?Cardiovascular:  Negative for chest pain and palpitations.  ?Gastrointestinal:  Negative for blood in stool, constipation, diarrhea, nausea and vomiting.  ?Genitourinary:  Negative for dysuria, frequency, hematuria and urgency.  ?Musculoskeletal:  Positive for joint pain (right hip). Negative for back pain, falls and myalgias.  ?Neurological:  Negative for dizziness, sensory change, loss of consciousness, weakness and headaches.  ?Endo/Heme/Allergies:  Negative for environmental allergies. Does not bruise/bleed easily.  ?Psychiatric/Behavioral:  Negative for depression and suicidal ideas. The patient is not nervous/anxious and does not have insomnia.   ? ?   ?Objective:  ?  ?Physical Exam ?Constitutional:   ?   General: She is not in acute distress. ?   Appearance: Normal appearance. She is not ill-appearing.  ?HENT:  ?   Head: Normocephalic and atraumatic.  ?   Right Ear: Tympanic membrane, ear canal and external ear normal.  ?   Left Ear: Tympanic membrane, ear canal and external ear normal.  ?Eyes:  ?   Extraocular Movements: Extraocular movements intact.  ?   Pupils: Pupils are equal, round, and reactive to light.  ?Cardiovascular:  ?   Rate and Rhythm: Normal rate and regular rhythm.  ?   Pulses: Normal pulses.  ?   Heart sounds: Normal heart sounds. No murmur heard. ?  No gallop.  ?Pulmonary:  ?   Effort: Pulmonary effort is normal. No respiratory distress.  ?   Breath sounds: Normal breath sounds. No  wheezing, rhonchi or rales.  ?Abdominal:  ?   General: Bowel sounds are normal. There is no distension.  ?   Palpations: Abdomen is soft. There is no mass.  ?   Tenderness: There is no abdominal tenderness. There is no guarding or rebound.  ?   Hernia: No hernia is present.  ?Musculoskeletal:  ?  Cervical back: Normal range of motion and neck supple.  ?Lymphadenopathy:  ?   Cervical: No cervical adenopathy.  ?Skin: ?   General: Skin is warm and dry.  ?Neurological:  ?   Mental Status: She is alert and oriented to person, place, and time.  ?Psychiatric:     ?   Behavior: Behavior normal.  ? ? ?BP 120/82 (BP Location: Left Arm, Patient Position: Sitting, Cuff Size: Normal)   Pulse 74   Temp (!) 97.5 ?F (36.4 ?C) (Oral)   Resp 16   Ht 5\' 6"  (1.676 m)   Wt 144 lb 9.6 oz (65.6 kg)   SpO2 97%   BMI 23.34 kg/m?  ?Wt Readings from Last 3 Encounters:  ?10/17/21 144 lb 9.6 oz (65.6 kg)  ?10/29/20 147 lb 14.4 oz (67.1 kg)  ?10/17/19 137 lb 6.4 oz (62.3 kg)  ? ? ?Diabetic Foot Exam - Simple   ?No data filed ?  ? ?Lab Results  ?Component Value Date  ? WBC 6.9 10/29/2020  ? HGB 12.4 10/29/2020  ? HCT 36.7 10/29/2020  ? PLT 292.0 10/29/2020  ? GLUCOSE 90 10/29/2020  ? CHOL 242 (H) 10/29/2020  ? TRIG 110.0 10/29/2020  ? HDL 81.40 10/29/2020  ? LDLCALC 139 (H) 10/29/2020  ? ALT 16 10/29/2020  ? AST 19 10/29/2020  ? NA 139 10/29/2020  ? K 3.9 10/29/2020  ? CL 103 10/29/2020  ? CREATININE 0.80 10/29/2020  ? BUN 23 10/29/2020  ? CO2 28 10/29/2020  ? TSH 3.81 10/17/2019  ? ? ?Lab Results  ?Component Value Date  ? TSH 3.81 10/17/2019  ? ?Lab Results  ?Component Value Date  ? WBC 6.9 10/29/2020  ? HGB 12.4 10/29/2020  ? HCT 36.7 10/29/2020  ? MCV 95.3 10/29/2020  ? PLT 292.0 10/29/2020  ? ?Lab Results  ?Component Value Date  ? NA 139 10/29/2020  ? K 3.9 10/29/2020  ? CO2 28 10/29/2020  ? GLUCOSE 90 10/29/2020  ? BUN 23 10/29/2020  ? CREATININE 0.80 10/29/2020  ? BILITOT 0.5 10/29/2020  ? ALKPHOS 58 10/29/2020  ? AST 19 10/29/2020   ? ALT 16 10/29/2020  ? PROT 6.8 10/29/2020  ? ALBUMIN 4.4 10/29/2020  ? CALCIUM 9.8 10/29/2020  ? GFR 77.30 10/29/2020  ? ?Lab Results  ?Component Value Date  ? CHOL 242 (H) 10/29/2020  ? ?Lab Results  ?Compon

## 2021-10-31 ENCOUNTER — Encounter: Payer: Medicare Other | Admitting: Family Medicine

## 2021-11-17 ENCOUNTER — Ambulatory Visit (HOSPITAL_COMMUNITY)
Admission: RE | Admit: 2021-11-17 | Discharge: 2021-11-17 | Disposition: A | Discharge status: Home / Self Care | Payer: Medicare Other | Source: Ambulatory Visit | Attending: Family Medicine | Admitting: Family Medicine

## 2021-11-17 DIAGNOSIS — E785 Hyperlipidemia, unspecified: Secondary | ICD-10-CM | POA: Insufficient documentation

## 2021-11-27 ENCOUNTER — Telehealth: Payer: Self-pay | Admitting: Family Medicine

## 2021-11-27 NOTE — Telephone Encounter (Signed)
Left message for patient to call back and schedule Medicare Annual Wellness Visit (AWV). Please offer to do virtually or by telephone.  Left office number and my jabber #[phone]. ? ?Due for AWVI ? ?Please schedule at anytime with Nurse Health Advisor. ?  ?

## 2021-12-12 ENCOUNTER — Telehealth: Payer: Self-pay | Admitting: Family Medicine

## 2021-12-12 MED ORDER — ESTRADIOL 0.1 MG/GM VA CREA: 1.0000 | TOPICAL_CREAM | VAGINAL | 2 refills | Status: DC

## 2021-12-12 NOTE — Telephone Encounter (Signed)
Patient did call back and stated that she uses it about 2 times a week because insurance does not cover it.

## 2021-12-12 NOTE — Telephone Encounter (Signed)
Pt states she was going to start seeing pcp for all gyno needs, as she gyno dr has retired. She is requesting a refill on rx, pleas advise if this can be filled.   Medication: estradiol (ESTRACE) 0.1 MG/GM vaginal cream   Has the patient contacted their pharmacy? No.   Preferred Pharmacy:  [id number] [place], [place] - [address] AT [facility] [address]   [address], [place] [address]  Phone:  [phone]  Fax:  [phone]

## 2021-12-12 NOTE — Telephone Encounter (Signed)
Pt called to confirm SIG of medication. LVM but sent med in for 3 times weekly since it states in external medications 2-3 times a week

## 2021-12-22 ENCOUNTER — Telehealth (INDEPENDENT_AMBULATORY_CARE_PROVIDER_SITE_OTHER): Payer: Medicare Other | Admitting: Medical

## 2021-12-22 ENCOUNTER — Other Ambulatory Visit: Payer: Self-pay | Admitting: Medical

## 2021-12-22 DIAGNOSIS — U071 COVID-19: Secondary | ICD-10-CM | POA: Diagnosis not present

## 2021-12-22 MED ORDER — MOLNUPIRAVIR EUA 200MG CAPSULE: 4.0000 | ORAL_CAPSULE | Freq: Two times a day (BID) | ORAL | 0 refills | Status: AC

## 2021-12-22 MED ORDER — FLUTICASONE PROPIONATE 50 MCG/ACT NA SUSP: 2.0000 | Freq: Every day | NASAL | 1 refills | Status: DC

## 2021-12-22 MED ORDER — BENZONATATE 100 MG PO CAPS
100.0000 mg | ORAL_CAPSULE | Freq: Three times a day (TID) | ORAL | 0 refills | Status: DC | PRN
Start: 2021-12-22 — End: 2022-10-19

## 2021-12-22 NOTE — Progress Notes (Signed)
   Subjective:    Patient ID: Anita Galloway, female    DOB: Dec 29, 1955, 66 y.o.   MRN: [id number]  HPI  Virtual Visit via Video Note  I connected with Anita Galloway on 12/22/21 at  3:20 PM EDT by a video enabled telemedicine application and verified that I am speaking with the correct person using two identifiers.  Location: Patient: home Provider: office   I discussed the limitations of evaluation and management by telemedicine and the availability of in person appointments. The patient expressed understanding and agreed to proceed.  History of Present Illness: Pt recently diagnosed with covid on  today.  Test used- otc tody  History of covid vaccine x 3.  Low  risk factors.  History of covid infection-no  Current symptoms- pt has mild nasal congestion, mid ha an runny nose.   Pt 02 sat- pt dos not have at home.  Pt had 2 initial series and one booster. All moderna.  Pt expressed she wants to hold of and not take antiviral presently but would reconsider if worsening.  Currently pt stats just feels like mild cold.     Observations/Objective: General- No acute distress. Pleasant patient. Neck- Full range of motion, no jvd Lungs- Clear, even and unlabored. Heart- regular rate and rhythm. Neurologic- CNII- XII grossly intact.   Assessment and Plan:  Patient Instructions  Early onset COVID infection with positive test today as well as symptom onset today.  Just got back from [place].  Formally vaccinated x3.  Overall think she has low risk score.  Advising rest, hydration, Tylenol for body aches or fever.  Prescribed Flonase for nasal congestion and benzonatate for cough.  Vaccine status and mild symptoms presently.  Did educate/discussed that antivirals can be given within the first 5 days.  Patient indicates that she wants to hold off and not take antibiotic presently but on discussion would reconsider if she has worsening or changing symptoms.   Making molnupiravir available at pharmacy.  If any shortness of breath do recommend getting O2 sat monitor and checking.  If any O2 sats less than 96% let us know.  Counseled on how to and short 5-day quarantine.  Follow-up in 7 days or sooner if needed.  Follow Up Instructions:    I discussed the assessment and treatment plan with the patient. The patient was provided an opportunity to ask questions and all were answered. The patient agreed with the plan and demonstrated an understanding of the instructions.   The patient was advised to call back or seek an in-person evaluation if the symptoms worsen or if the condition fails to improve as anticipated.     Esperanza Richters, PA-C   Review of Systems  Constitutional:  Negative for chills, fatigue and fever.  HENT:  Positive for congestion, rhinorrhea and sore throat.        Faint st.  Respiratory:  Negative for choking, shortness of breath and wheezing.   Gastrointestinal:  Negative for abdominal pain.  Neurological:  Negative for dizziness and headaches.  Hematological:  Negative for adenopathy.       Objective:   Physical Exam        Assessment & Plan:

## 2021-12-22 NOTE — Patient Instructions (Signed)
Early onset COVID infection with positive test today as well as symptom onset today.  Just got back from [place].  Formally vaccinated x3.  Overall think she has low risk score.  Advising rest, hydration, Tylenol for body aches or fever.  Prescribed Flonase for nasal congestion and benzonatate for cough.  Vaccine status and mild symptoms presently.  Did educate/discussed that antivirals can be given within the first 5 days.  Patient indicates that she wants to hold off and not take antibiotic presently but on discussion would reconsider if she has worsening or changing symptoms.  Making molnupiravir available at pharmacy.  If any shortness of breath do recommend getting O2 sat monitor and checking.  If any O2 sats less than 96% let us know.  Counseled on how to and short 5-day quarantine.  Follow-up in 7 days or sooner if needed.

## 2021-12-29 ENCOUNTER — Ambulatory Visit (INDEPENDENT_AMBULATORY_CARE_PROVIDER_SITE_OTHER): Payer: Medicare Other

## 2021-12-29 VITALS — Ht 66.0 in | Wt 144.0 lb

## 2021-12-29 DIAGNOSIS — Z0000 Encounter for general adult medical examination without abnormal findings: Secondary | ICD-10-CM | POA: Diagnosis not present

## 2021-12-29 NOTE — Patient Instructions (Signed)
Anita Galloway , Thank you for taking time to complete your Medicare Wellness Visit. I appreciate your ongoing commitment to your health goals. Please review the following plan we discussed and let me know if I can assist you in the future.   Screening recommendations/referrals: Colonoscopy: Completed 06/20/2018-Due 06/20/2028 Mammogram: Completed 05/14/2021-Due 05/14/2022 Bone Density: Due-Per our conversation, you plan to complete with mammogram in December. Recommended yearly ophthalmology/optometry visit for glaucoma screening and checkup Recommended yearly dental visit for hygiene and checkup  Vaccinations: Influenza vaccine: Declined Pneumococcal vaccine: Up to date Tdap vaccine: Up to date Shingles vaccine: Completed vaccines   Covid-19:Booster available at the pharmacy  Advanced directives: Please bring a copy of Living Will and/or Healthcare Power of Attorney for your chart.   Conditions/risks identified: See problem list  Next appointment: Follow up in one year for your annual wellness visit    Preventive Care 65 Years and Older, Female Preventive care refers to lifestyle choices and visits with your health care provider that can promote health and wellness. What does preventive care include? A yearly physical exam. This is also called an annual well check. Dental exams once or twice a year. Routine eye exams. Ask your health care provider how often you should have your eyes checked. Personal lifestyle choices, including: Daily care of your teeth and gums. Regular physical activity. Eating a healthy diet. Avoiding tobacco and drug use. Limiting alcohol use. Practicing safe sex. Taking low-dose aspirin every day. Taking vitamin and mineral supplements as recommended by your health care provider. What happens during an annual well check? The services and screenings done by your health care provider during your annual well check will depend on your age, overall health,  lifestyle risk factors, and family history of disease. Counseling  Your health care provider may ask you questions about your: Alcohol use. Tobacco use. Drug use. Emotional well-being. Home and relationship well-being. Sexual activity. Eating habits. History of falls. Memory and ability to understand (cognition). Work and work Astronomer. Reproductive health. Screening  You may have the following tests or measurements: Height, weight, and BMI. Blood pressure. Lipid and cholesterol levels. These may be checked every 5 years, or more frequently if you are over 18 years old. Skin check. Lung cancer screening. You may have this screening every year starting at age 18 if you have a 30-pack-year history of smoking and currently smoke or have quit within the past 15 years. Fecal occult blood test (FOBT) of the stool. You may have this test every year starting at age 24. Flexible sigmoidoscopy or colonoscopy. You may have a sigmoidoscopy every 5 years or a colonoscopy every 10 years starting at age 46. Hepatitis C blood test. Hepatitis B blood test. Sexually transmitted disease (STD) testing. Diabetes screening. This is done by checking your blood sugar (glucose) after you have not eaten for a while (fasting). You may have this done every 1-3 years. Bone density scan. This is done to screen for osteoporosis. You may have this done starting at age 8. Mammogram. This may be done every 1-2 years. Talk to your health care provider about how often you should have regular mammograms. Talk with your health care provider about your test results, treatment options, and if necessary, the need for more tests. Vaccines  Your health care provider may recommend certain vaccines, such as: Influenza vaccine. This is recommended every year. Tetanus, diphtheria, and acellular pertussis (Tdap, Td) vaccine. You may need a Td booster every 10 years. Zoster vaccine. You may need  this after age  83. Pneumococcal 13-valent conjugate (PCV13) vaccine. One dose is recommended after age 52. Pneumococcal polysaccharide (PPSV23) vaccine. One dose is recommended after age 50. Talk to your health care provider about which screenings and vaccines you need and how often you need them. This information is not intended to replace advice given to you by your health care provider. Make sure you discuss any questions you have with your health care provider. Document Released: 06/21/2015 Document Revised: 02/12/2016 Document Reviewed: 03/26/2015 Elsevier Interactive Patient Education  2017 [place] Prevention in the Home Falls can cause injuries. They can happen to people of all ages. There are many things you can do to make your home safe and to help prevent falls. What can I do on the outside of my home? Regularly fix the edges of walkways and driveways and fix any cracks. Remove anything that might make you trip as you walk through a door, such as a raised step or threshold. Trim any bushes or trees on the path to your home. Use bright outdoor lighting. Clear any walking paths of anything that might make someone trip, such as rocks or tools. Regularly check to see if handrails are loose or broken. Make sure that both sides of any steps have handrails. Any raised decks and porches should have guardrails on the edges. Have any leaves, snow, or ice cleared regularly. Use sand or salt on walking paths during winter. Clean up any spills in your garage right away. This includes oil or grease spills. What can I do in the bathroom? Use night lights. Install grab bars by the toilet and in the tub and shower. Do not use towel bars as grab bars. Use non-skid mats or decals in the tub or shower. If you need to sit down in the shower, use a plastic, non-slip stool. Keep the floor dry. Clean up any water that spills on the floor as soon as it happens. Remove soap buildup in the tub or shower  regularly. Attach bath mats securely with double-sided non-slip rug tape. Do not have throw rugs and other things on the floor that can make you trip. What can I do in the bedroom? Use night lights. Make sure that you have a light by your bed that is easy to reach. Do not use any sheets or blankets that are too big for your bed. They should not hang down onto the floor. Have a firm chair that has side arms. You can use this for support while you get dressed. Do not have throw rugs and other things on the floor that can make you trip. What can I do in the kitchen? Clean up any spills right away. Avoid walking on wet floors. Keep items that you use a lot in easy-to-reach places. If you need to reach something above you, use a strong step stool that has a grab bar. Keep electrical cords out of the way. Do not use floor polish or wax that makes floors slippery. If you must use wax, use non-skid floor wax. Do not have throw rugs and other things on the floor that can make you trip. What can I do with my stairs? Do not leave any items on the stairs. Make sure that there are handrails on both sides of the stairs and use them. Fix handrails that are broken or loose. Make sure that handrails are as long as the stairways. Check any carpeting to make sure that it is firmly attached to  the stairs. Fix any carpet that is loose or worn. Avoid having throw rugs at the top or bottom of the stairs. If you do have throw rugs, attach them to the floor with carpet tape. Make sure that you have a light switch at the top of the stairs and the bottom of the stairs. If you do not have them, ask someone to add them for you. What else can I do to help prevent falls? Wear shoes that: Do not have high heels. Have rubber bottoms. Are comfortable and fit you well. Are closed at the toe. Do not wear sandals. If you use a stepladder: Make sure that it is fully opened. Do not climb a closed stepladder. Make sure that  both sides of the stepladder are locked into place. Ask someone to hold it for you, if possible. Clearly mark and make sure that you can see: Any grab bars or handrails. First and last steps. Where the edge of each step is. Use tools that help you move around (mobility aids) if they are needed. These include: Canes. Walkers. Scooters. Crutches. Turn on the lights when you go into a dark area. Replace any light bulbs as soon as they burn out. Set up your furniture so you have a clear path. Avoid moving your furniture around. If any of your floors are uneven, fix them. If there are any pets around you, be aware of where they are. Review your medicines with your doctor. Some medicines can make you feel dizzy. This can increase your chance of falling. Ask your doctor what other things that you can do to help prevent falls. This information is not intended to replace advice given to you by your health care provider. Make sure you discuss any questions you have with your health care provider. Document Released: 03/21/2009 Document Revised: 10/31/2015 Document Reviewed: 06/29/2014 Elsevier Interactive Patient Education  2017 [employer].

## 2021-12-29 NOTE — Progress Notes (Signed)
Subjective:   Anita Galloway is a 66 y.o. female who presents for an Initial Medicare Annual Wellness Visit.  I connected with Luceal today by telephone and verified that I am speaking with the correct person using two identifiers. Location patient: home Location provider: work Persons participating in the virtual visit: patient, Engineer, civil (consulting).    I discussed the limitations, risks, security and privacy concerns of performing an evaluation and management service by telephone and the availability of in person appointments. I also discussed with the patient that there may be a patient responsible charge related to this service. The patient expressed understanding and verbally consented to this telephonic visit.    Interactive audio and video telecommunications were attempted between this provider and patient, however failed, due to patient having technical difficulties OR patient did not have access to video capability.  We continued and completed visit with audio only.  Some vital signs may be absent or patient reported.   Time Spent with patient on telephone encounter: 20 minutes   Review of Systems     Cardiac Risk Factors include: advanced age (>16men, >32 women);dyslipidemia     Objective:    Today's Vitals   12/29/21 1229  Weight: 144 lb (65.3 kg)  Height: 5\' 6"  (1.676 m)   Body mass index is 23.24 kg/m.     12/29/2021   12:31 PM  Advanced Directives  Does Patient Have a Medical Advance Directive? Yes  Type of 12/31/2021 of [place];Living will  Copy of Healthcare Power of Attorney in Chart? No - copy requested    Current Medications (verified) Outpatient Encounter Medications as of 12/29/2021  Medication Sig   benzonatate (TESSALON) 100 MG capsule Take 1 capsule (100 mg total) by mouth 3 (three) times daily as needed for cough.   Biotin 12/31/2021 MCG TABS Take by mouth.   Calcium Carbonate-Vitamin D (CALCIUM-VITAMIN D) 500-200 MG-UNIT per  tablet Take 2 tablets by mouth daily.    cholecalciferol (VITAMIN D) 1000 UNITS tablet Take 2,000 Units by mouth daily.    estradiol (ESTRACE) 0.1 MG/GM vaginal cream Place 1 Applicatorful vaginally 3 (three) times a week.   fluticasone (FLONASE) 50 MCG/ACT nasal spray Place 2 sprays into both nostrils daily.   MELATONIN PO Take by mouth as needed.   Multiple Vitamin (MULTIVITAMIN) tablet Take 1 tablet by mouth daily.   Omega-3 Fatty Acids (FISH OIL) 1000 MG CAPS Take 1 capsule by mouth daily.   zolpidem (AMBIEN) 5 MG tablet TAKE 1 TABLET BY MOUTH EVERY DAY AT BEDTIME AS NEEDED   No facility-administered encounter medications on file as of 12/29/2021.    Allergies (verified) Patient has no known allergies.   History: Past Medical History:  Diagnosis Date   Phlebitis    after C-section---left leg   Past Surgical History:  Procedure Laterality Date   CESAREAN SECTION     x's 2   COLONOSCOPY  03/07/2008   w/Dr.Brodie   DILATION AND CURETTAGE OF UTERUS  11/2013   MASS EXCISION  02/23/2012   Procedure: MINOR EXCISION OF MASS;  Surgeon: 02/25/2012., MD;  Location: [facility];  Service: Orthopedics;  Laterality: Left;  excision cyst left long finger   MOHS SURGERY Left    08/2016   r 5th metacarpal Right 02/2019   gary kuzma    Family History  Problem Relation Age of Onset   Alzheimer's disease Mother    Cancer Mother 65  BREAST CA   Breast cancer Mother 18   Diabetes Father    Heart disease Father        CAD, CHF, CABG   Osteoporosis Sister    Mental retardation Sister    Breast cancer Other    Diabetes Other    Coronary artery disease Other    Colon cancer Neg Hx    Colon polyps Neg Hx    Esophageal cancer Neg Hx    Rectal cancer Neg Hx    Stomach cancer Neg Hx    Social History   Socioeconomic History   Marital status: Married    Spouse name: Not on file   Number of children: Not on file   Years of education: Not on file   Highest  education level: Not on file  Occupational History   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  Tobacco Use   Smoking status: Former   Smokeless tobacco: Never   Tobacco comments:    smoked in college  Vaping Use   Vaping Use: Never used  Substance and Sexual Activity   Alcohol use: Yes    Alcohol/week: 7.0 - 14.0 standard drinks of alcohol    Types: 7 - 14 Glasses of wine per week    Comment: 1-2 glasses wine per night per pt   Drug use: No   Sexual activity: Yes    Partners: Male  Other Topics Concern   Not on file  Social History Narrative   Exercising-- treadmill, walk, elliptical   Social Determinants of Health   Financial Resource Strain: Low Risk  (12/29/2021)   Overall Financial Resource Strain (CARDIA)    Difficulty of Paying Living Expenses: Not hard at all  Food Insecurity: No Food Insecurity (12/29/2021)   Hunger Vital Sign    Worried About Running Out of Food in the Last Year: Never true    Ran Out of Food in the Last Year: Never true  Transportation Needs: No Transportation Needs (12/29/2021)   PRAPARE - Administrator, Civil Service (Medical): No    Lack of Transportation (Non-Medical): No  Physical Activity: Sufficiently Active (12/29/2021)   Exercise Vital Sign    Days of Exercise per Week: 5 days    Minutes of Exercise per Session: 30 min  Stress: No Stress Concern Present (12/29/2021)   [employer] of Occupational Health - Occupational Stress Questionnaire    Feeling of Stress : Not at all  Social Connections: Socially Integrated (12/29/2021)   Social Connection and Isolation Panel [NHANES]    Frequency of Communication with Friends and Family: More than three times a week    Frequency of Social Gatherings with Friends and Family: More than three times a week    Attends Religious Services: More than 4 times per year    Active Member of [employer] or Organizations: Yes    Attends Engineer, structural: More than 4 times per year     Marital Status: Married    Tobacco Counseling Counseling given: Not Answered Tobacco comments: smoked in college   Clinical Intake:  Pre-visit preparation completed: Yes  Pain : No/denies pain     BMI - recorded: 23.24 Nutritional Status: BMI of 19-24  Normal Nutritional Risks: None Diabetes: No  How often do you need to have someone help you when you read instructions, pamphlets, or other written materials from your doctor or pharmacy?: 1 - Never  Diabetic?No  Interpreter Needed?: No  Information entered by :: Thomasenia Sales LPN  Activities of Daily Living    12/29/2021   12:33 PM 12/22/2021    3:01 PM  In your present state of health, do you have any difficulty performing the following activities:  Hearing? 0 0  Vision? 0 0  Difficulty concentrating or making decisions? 0 0  Walking or climbing stairs? 0 0  Dressing or bathing? 0 0  Doing errands, shopping? 0 0  Preparing Food and eating ? N N  Using the Toilet? N N  In the past six months, have you accidently leaked urine? N N  Do you have problems with loss of bowel control? N N  Managing your Medications? N N  Managing your Finances? N N  Housekeeping or managing your Housekeeping? N N    Patient Care Team: Zola Button, Grayling Congress, DO as PCP - General [place], Amy, MD as Consulting Physician (Dermatology) Freddy Finner, MD as Consulting Physician (Obstetrics and Gynecology) Blima Ledger, OD (Optometry) Cindee Salt, MD as Consulting Physician (Orthopedic Surgery) Cyndee Brightly Verdene Lennert, DMD as Consulting Physician (Dentistry) Rae Halsted, MD as Referring Physician (Orthopedic Surgery)  Indicate any recent Medical Services you may have received from other than [facility] providers in the past year (date may be approximate).     Assessment:   This is a routine wellness examination for [facility].  Hearing/Vision screen Hearing Screening - Comments:: No issues Vision Screening - Comments:: Last eye exam-6  months ago-Dr. Hyacinth Meeker  Dietary issues and exercise activities discussed: Current Exercise Habits: Home exercise routine, Type of exercise: walking;Other - see comments (hiking, aerobics, tennis), Time (Minutes): 30, Frequency (Times/Week): 5, Weekly Exercise (Minutes/Week): 150, Exercise limited by: None identified   Goals Addressed             This Visit's Progress    Patient Stated       Increase activity & watch what I eat       Depression Screen    12/29/2021   12:33 PM 10/29/2020    3:54 PM 10/17/2019   10:09 AM 10/19/2017    9:44 AM 10/15/2015    9:57 AM  PHQ 2/9 Scores  PHQ - 2 Score 0 0 0 0 0    Fall Risk    12/29/2021   12:32 PM 12/22/2021    3:01 PM 10/17/2021   10:01 AM 10/29/2020    3:54 PM  Fall Risk   Falls in the past year? 0 0 0 0  Number falls in past yr: 0 0 0 0  Injury with Fall? 0 0 0 0  Follow up Falls prevention discussed  Falls evaluation completed Falls evaluation completed    FALL RISK PREVENTION PERTAINING TO THE HOME:  Any stairs in or around the home? Yes  If so, are there any without handrails? No  Home free of loose throw rugs in walkways, pet beds, electrical cords, etc? Yes  Adequate lighting in your home to reduce risk of falls? Yes   ASSISTIVE DEVICES UTILIZED TO PREVENT FALLS:  Life alert? No  Use of a cane, walker or w/c? No  Grab bars in the bathroom? No  Shower chair or bench in shower? Yes  Elevated toilet seat or a handicapped toilet? No   TIMED UP AND GO:  Was the test performed? No . Phone visit   Cognitive Function:Normal cognitive status assessed by this Nurse Health Advisor. No abnormalities found.      10/29/2020    4:24 PM  MMSE - Mini Mental State Exam  Orientation  to time 5  Orientation to Place 5  Registration 3  Attention/ Calculation 5  Recall 3  Language- name 2 objects 2  Language- repeat 1  Language- follow 3 step command 3  Language- read & follow direction 1  Write a sentence 1  Copy design 1   Total score 30        Immunizations Immunization History  Administered Date(s) Administered   Influenza-Unspecified 03/25/2018   Moderna Sars-Covid-2 Vaccination 09/06/2019, 10/06/2019, 06/02/2020   PNEUMOCOCCAL CONJUGATE-20 10/29/2020   Td 09/05/2004   Tdap 10/11/2014   Zoster Recombinat (Shingrix) 11/21/2017, 03/25/2018   Zoster, Live 10/15/2015    TDAP status: Up to date  Flu Vaccine status: Up to date  Pneumococcal vaccine status: Up to date  Covid-19 vaccine status: Information provided on how to obtain vaccines.   Qualifies for Shingles Vaccine? No   Zostavax completed Yes   Shingrix Completed?: Yes  Screening Tests Health Maintenance  Topic Date Due   COVID-19 Vaccine (4 - Moderna series) 07/28/2020   DEXA SCAN  04/12/2021   INFLUENZA VACCINE  01/06/2022   MAMMOGRAM  05/14/2022   TETANUS/TDAP  10/10/2024   COLONOSCOPY (Pts 45-78yrs Insurance coverage will need to be confirmed)  06/20/2028   Pneumonia Vaccine 11+ Years old  Completed   Hepatitis C Screening  Completed   Zoster Vaccines- Shingrix  Completed   HPV VACCINES  Aged Out    Health Maintenance  Health Maintenance Due  Topic Date Due   COVID-19 Vaccine (4 - Moderna series) 07/28/2020   DEXA SCAN  04/12/2021    Colorectal cancer screening: Type of screening: Colonoscopy. Completed 06/20/2018. Repeat every 10 years  Mammogram status: Completed 05/14/2021-bilateral. Repeat every year  Bone Density status: Due-Patient plans to complete with mammogram in December.  Lung Cancer Screening: (Low Dose CT Chest recommended if Age 50-80 years, 30 pack-year currently smoking OR have quit w/in 15years.) does not qualify.     Additional Screening:  Hepatitis C Screening: Completed 10/15/2015  Vision Screening: Recommended annual ophthalmology exams for early detection of glaucoma and other disorders of the eye. Is the patient up to date with their annual eye exam?  Yes  Who is the provider or what is  the name of the office in which the patient attends annual eye exams? Dr. Hyacinth Meeker   Dental Screening: Recommended annual dental exams for proper oral hygiene  Community Resource Referral / Chronic Care Management: CRR required this visit?  No   CCM required this visit?  No      Plan:     I have personally reviewed and noted the following in the patient's chart:   Medical and social history Use of alcohol, tobacco or illicit drugs  Current medications and supplements including opioid prescriptions. Patient is not currently taking opioid prescriptions. Functional ability and status Nutritional status Physical activity Advanced directives List of other physicians Hospitalizations, surgeries, and ER visits in previous 12 months Vitals Screenings to include cognitive, depression, and falls Referrals and appointments  In addition, I have reviewed and discussed with patient certain preventive protocols, quality metrics, and best practice recommendations. A written personalized care plan for preventive services as well as general preventive health recommendations were provided to patient.   Due to this being a telephonic visit, the after visit summary with patients personalized plan was offered to patient via mail or my-chart. Patient would like to access on my-chart   Roanna Raider, LPN   [id number]  Nurse Health Advisor  Nurse  Notes: None

## 2022-04-07 ENCOUNTER — Telehealth: Payer: Self-pay | Admitting: Family Medicine

## 2022-04-07 ENCOUNTER — Other Ambulatory Visit: Payer: Self-pay | Admitting: Family Medicine

## 2022-04-07 DIAGNOSIS — Z1231 Encounter for screening mammogram for malignant neoplasm of breast: Secondary | ICD-10-CM

## 2022-04-07 DIAGNOSIS — E2839 Other primary ovarian failure: Secondary | ICD-10-CM

## 2022-04-07 NOTE — Telephone Encounter (Signed)
Pt called stating that, per her last appt with Dr. Etter Sjogren, she can schedule her bone density test at the same time as her mammogram. When she went to schedule with [facility] they stated they had no active order. After reviewing chart, did not see an active order for Dexa Scan. Pt would like this to be pout in so that both can be done and would like a call to indicate order has been put in.

## 2022-04-08 NOTE — Telephone Encounter (Signed)
My chart message sent to pt.

## 2022-04-20 ENCOUNTER — Ambulatory Visit
Admission: RE | Admit: 2022-04-20 | Discharge: 2022-04-20 | Disposition: A | Discharge status: Home / Self Care | Payer: Medicare Other | Source: Ambulatory Visit | Attending: Family Medicine | Admitting: Family Medicine

## 2022-04-20 DIAGNOSIS — Z1231 Encounter for screening mammogram for malignant neoplasm of breast: Secondary | ICD-10-CM

## 2022-05-27 ENCOUNTER — Ambulatory Visit
Admission: RE | Admit: 2022-05-27 | Discharge: 2022-05-27 | Disposition: A | Discharge status: Home / Self Care | Payer: Medicare Other | Source: Ambulatory Visit | Attending: Family Medicine | Admitting: Family Medicine

## 2022-09-28 ENCOUNTER — Ambulatory Visit
Admission: RE | Admit: 2022-09-28 | Discharge: 2022-09-28 | Disposition: A | Discharge status: Home / Self Care | Payer: Medicare Other | Source: Ambulatory Visit | Attending: Family Medicine | Admitting: Family Medicine

## 2022-09-28 DIAGNOSIS — E2839 Other primary ovarian failure: Secondary | ICD-10-CM

## 2022-10-19 ENCOUNTER — Encounter: Payer: Self-pay | Admitting: Family Medicine

## 2022-10-19 ENCOUNTER — Ambulatory Visit (INDEPENDENT_AMBULATORY_CARE_PROVIDER_SITE_OTHER): Payer: Medicare Other | Admitting: Family Medicine

## 2022-10-19 VITALS — BP 118/80 | HR 85 | Temp 97.6°F | Resp 18 | Ht 66.0 in | Wt 145.8 lb

## 2022-10-19 DIAGNOSIS — E559 Vitamin D deficiency, unspecified: Secondary | ICD-10-CM

## 2022-10-19 DIAGNOSIS — E785 Hyperlipidemia, unspecified: Secondary | ICD-10-CM

## 2022-10-19 NOTE — Assessment & Plan Note (Signed)
Encourage heart healthy diet such as MIND or DASH diet, increase exercise, avoid trans fats, simple carbohydrates and processed foods, consider a krill or fish or flaxseed oil cap daily.  °

## 2022-10-19 NOTE — Patient Instructions (Signed)
Cholesterol Content in Foods ?Cholesterol is a waxy, fat-like substance that helps to carry fat in the blood. The body needs cholesterol in small amounts, but too much cholesterol can cause damage to the arteries and heart. ?What foods have cholesterol? ? ?Cholesterol is found in animal-based foods, such as meat, seafood, and dairy. Generally, low-fat dairy and lean meats have less cholesterol than full-fat dairy and fatty meats. The milligrams of cholesterol per serving (mg per serving) of common cholesterol-containing foods are listed below. ?Meats and other proteins ?Egg -- one large whole egg has 186 mg. ?Veal shank -- 4 oz (113 g) has 141 mg. ?Lean ground turkey (93% lean) -- 4 oz (113 g) has 118 mg. ?Fat-trimmed lamb loin -- 4 oz (113 g) has 106 mg. ?Lean ground beef (90% lean) -- 4 oz (113 g) has 100 mg. ?Lobster -- 3.5 oz (99 g) has 90 mg. ?Pork loin chops -- 4 oz (113 g) has 86 mg. ?Canned salmon -- 3.5 oz (99 g) has 83 mg. ?Fat-trimmed beef top loin -- 4 oz (113 g) has 78 mg. ?Frankfurter -- 1 frank (3.5 oz or 99 g) has 77 mg. ?Crab -- 3.5 oz (99 g) has 71 mg. ?Roasted chicken without skin, white meat -- 4 oz (113 g) has 66 mg. ?Light bologna -- 2 oz (57 g) has 45 mg. ?Deli-cut turkey -- 2 oz (57 g) has 31 mg. ?Canned tuna -- 3.5 oz (99 g) has 31 mg. ?Bacon -- 1 oz (28 g) has 29 mg. ?Oysters and mussels (raw) -- 3.5 oz (99 g) has 25 mg. ?Mackerel -- 1 oz (28 g) has 22 mg. ?Trout -- 1 oz (28 g) has 20 mg. ?Pork sausage -- 1 link (1 oz or 28 g) has 17 mg. ?Salmon -- 1 oz (28 g) has 16 mg. ?Tilapia -- 1 oz (28 g) has 14 mg. ?Dairy ?Soft-serve ice cream -- ? cup (4 oz or 86 g) has 103 mg. ?Whole-milk yogurt -- 1 cup (8 oz or 245 g) has 29 mg. ?Cheddar cheese -- 1 oz (28 g) has 28 mg. ?American cheese -- 1 oz (28 g) has 28 mg. ?Whole milk -- 1 cup (8 oz or 250 mL) has 23 mg. ?2% milk -- 1 cup (8 oz or 250 mL) has 18 mg. ?Cream cheese -- 1 tablespoon (Tbsp) (14.5 g) has 15 mg. ?Cottage cheese -- ? cup (4 oz or  113 g) has 14 mg. ?Low-fat (1%) milk -- 1 cup (8 oz or 250 mL) has 10 mg. ?Sour cream -- 1 Tbsp (12 g) has 8.5 mg. ?Low-fat yogurt -- 1 cup (8 oz or 245 g) has 8 mg. ?Nonfat Greek yogurt -- 1 cup (8 oz or 228 g) has 7 mg. ?Half-and-half cream -- 1 Tbsp (15 mL) has 5 mg. ?Fats and oils ?Cod liver oil -- 1 tablespoon (Tbsp) (13.6 g) has 82 mg. ?Butter -- 1 Tbsp (14 g) has 15 mg. ?Lard -- 1 Tbsp (12.8 g) has 14 mg. ?Bacon grease -- 1 Tbsp (12.9 g) has 14 mg. ?Mayonnaise -- 1 Tbsp (13.8 g) has 5-10 mg. ?Margarine -- 1 Tbsp (14 g) has 3-10 mg. ?The items listed above may not be a complete list of foods with cholesterol. Exact amounts of cholesterol in these foods may vary depending on specific ingredients and brands. Contact a dietitian for more information. ?What foods do not have cholesterol? ?Most plant-based foods do not have cholesterol unless you combine them with a food that has   cholesterol. Foods without cholesterol include: ?Grains and cereals. ?Vegetables. ?Fruits. ?Vegetable oils, such as olive, canola, and sunflower oil. ?Legumes, such as peas, beans, and lentils. ?Nuts and seeds. ?Egg whites. ?The items listed above may not be a complete list of foods that do not have cholesterol. Contact a dietitian for more information. ?Summary ?The body needs cholesterol in small amounts, but too much cholesterol can cause damage to the arteries and heart. ?Cholesterol is found in animal-based foods, such as meat, seafood, and dairy. Generally, low-fat dairy and lean meats have less cholesterol than full-fat dairy and fatty meats. ?This information is not intended to replace advice given to you by your health care provider. Make sure you discuss any questions you have with your health care provider. ?Document Revised: 10/04/2020 Document Reviewed: 10/04/2020 ?Elsevier Patient Education ? 2023 [employer] ? ?

## 2022-10-19 NOTE — Progress Notes (Signed)
Established Patient Office Visit  Subjective   Patient ID: Anita Galloway, female    DOB: [birth year]  Age: 67 y.o. MRN: [id number]  Chief Complaint  Patient presents with   Annual Exam    Pt states fasting.    HPI Pt is here for cpe and f/u, cholesterol.  No complaints  Patient Active Problem List   Diagnosis Date Noted   Vitamin D deficiency 10/17/2021   Hyperlipidemia 10/29/2020   Osteopenia 09/27/2013   FROZEN RIGHT SHOULDER 09/03/2009   THROMBOPHLEBITIS NOS 01/18/2007   Past Medical History:  Diagnosis Date   Phlebitis    after C-section---left leg   Past Surgical History:  Procedure Laterality Date   CESAREAN SECTION     x's 2   COLONOSCOPY  03/07/2008   w/Dr.Brodie   DILATION AND CURETTAGE OF UTERUS  11/2013   MASS EXCISION  02/23/2012   Procedure: MINOR EXCISION OF MASS;  Surgeon: Wyn Forster., MD;  Location: [facility];  Service: Orthopedics;  Laterality: Left;  excision cyst left long finger   MOHS SURGERY Left    08/2016   r 5th metacarpal Right 02/2019   gary kuzma    Social History   Tobacco Use   Smoking status: Former   Smokeless tobacco: Never   Tobacco comments:    smoked in college  Vaping Use   Vaping Use: Never used  Substance Use Topics   Alcohol use: Yes    Alcohol/week: 7.0 - 14.0 standard drinks of alcohol    Types: 7 - 14 Glasses of wine per week    Comment: 1-2 glasses wine per night per pt   Drug use: No   Social History   Socioeconomic History   Marital status: Married    Spouse name: Not on file   Number of children: Not on file   Years of education: Not on file   Highest education level: Not on file  Occupational History   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  Tobacco Use   Smoking status: Former   Smokeless tobacco: Never   Tobacco comments:    smoked in college  Vaping Use   Vaping Use: Never used  Substance and Sexual Activity   Alcohol use: Yes    Alcohol/week: 7.0 - 14.0  standard drinks of alcohol    Types: 7 - 14 Glasses of wine per week    Comment: 1-2 glasses wine per night per pt   Drug use: No   Sexual activity: Yes    Partners: Male  Other Topics Concern   Not on file  Social History Narrative   Exercising-- treadmill, walk, elliptical   Social Determinants of Health   Financial Resource Strain: Low Risk  (12/29/2021)   Overall Financial Resource Strain (CARDIA)    Difficulty of Paying Living Expenses: Not hard at all  Food Insecurity: No Food Insecurity (12/29/2021)   Hunger Vital Sign    Worried About Running Out of Food in the Last Year: Never true    Ran Out of Food in the Last Year: Never true  Transportation Needs: No Transportation Needs (12/29/2021)   PRAPARE - Administrator, Civil Service (Medical): No    Lack of Transportation (Non-Medical): No  Physical Activity: Sufficiently Active (12/29/2021)   Exercise Vital Sign    Days of Exercise per Week: 5 days    Minutes of Exercise per Session: 30 min  Stress: No Stress Concern Present (12/29/2021)   [employer]  of Occupational Health - Occupational Stress Questionnaire    Feeling of Stress : Not at all  Social Connections: Socially Integrated (12/29/2021)   Social Connection and Isolation Panel [NHANES]    Frequency of Communication with Friends and Family: More than three times a week    Frequency of Social Gatherings with Friends and Family: More than three times a week    Attends Religious Services: More than 4 times per year    Active Member of [employer] or Organizations: Yes    Attends Engineer, structural: More than 4 times per year    Marital Status: Married  Catering manager Violence: Not At Risk (12/29/2021)   Humiliation, Afraid, Rape, and Kick questionnaire    Fear of Current or Ex-Partner: No    Emotionally Abused: No    Physically Abused: No    Sexually Abused: No   Family Status  Relation Name Status   Mother  Deceased   Father  Deceased at  age 58       CHF   Sister  Alive   Sister  Armed forces training and education officer   Other  (Not Specified)   Other  (Not Specified)   Neg Hx  (Not Specified)   Family History  Problem Relation Age of Onset   Alzheimer's disease Mother    Cancer Mother 63       BREAST CA   Breast cancer Mother 58   Diabetes Father    Heart disease Father        CAD, CHF, CABG   Osteoporosis Sister    Mental retardation Sister    Diabetes Other    Coronary artery disease Other    Colon cancer Neg Hx    Colon polyps Neg Hx    Esophageal cancer Neg Hx    Rectal cancer Neg Hx    Stomach cancer Neg Hx    No Known Allergies    Review of Systems  Constitutional:  Negative for fever and malaise/fatigue.  HENT:  Negative for congestion.   Eyes:  Negative for blurred vision.  Respiratory:  Negative for shortness of breath.   Cardiovascular:  Negative for chest pain, palpitations and leg swelling.  Gastrointestinal:  Negative for abdominal pain, blood in stool and nausea.  Genitourinary:  Negative for dysuria and frequency.  Musculoskeletal:  Negative for falls.  Skin:  Negative for rash.  Neurological:  Negative for dizziness, loss of consciousness and headaches.  Endo/Heme/Allergies:  Negative for environmental allergies.  Psychiatric/Behavioral:  Negative for depression. The patient is not nervous/anxious.       Objective:     BP 118/80 (BP Location: Left Arm, Patient Position: Sitting, Cuff Size: Normal)   Pulse 85   Temp 97.6 F (36.4 C) (Oral)   Resp 18   Ht 5\' 6"  (1.676 m)   Wt 145 lb 12.8 oz (66.1 kg)   SpO2 99%   BMI 23.53 kg/m  BP Readings from Last 3 Encounters:  10/19/22 118/80  10/17/21 120/82  10/29/20 130/86   Wt Readings from Last 3 Encounters:  10/19/22 145 lb 12.8 oz (66.1 kg)  12/29/21 144 lb (65.3 kg)  10/17/21 144 lb 9.6 oz (65.6 kg)   SpO2 Readings from Last 3 Encounters:  10/19/22 99%  10/17/21 97%  10/29/20 98%      Physical Exam Vitals and nursing note reviewed.   Constitutional:      General: She is not in acute distress.    Appearance: She is well-developed.  HENT:  Right Ear: External ear normal.     Left Ear: External ear normal.     Nose: Nose normal.  Eyes:     Conjunctiva/sclera: Conjunctivae normal.     Pupils: Pupils are equal, round, and reactive to light.  Cardiovascular:     Rate and Rhythm: Normal rate and regular rhythm.     Heart sounds: Normal heart sounds. No murmur heard. Pulmonary:     Effort: Pulmonary effort is normal. No respiratory distress.     Breath sounds: Normal breath sounds. No wheezing or rales.  Chest:     Chest wall: No tenderness.  Abdominal:     General: Abdomen is flat.     Palpations: Abdomen is soft.  Musculoskeletal:     Cervical back: Normal range of motion and neck supple.  Skin:    General: Skin is warm.     Findings: No erythema.  Neurological:     General: No focal deficit present.     Mental Status: She is alert and oriented to person, place, and time.     Motor: No weakness.     Gait: Gait normal.     Deep Tendon Reflexes: Reflexes normal.  Psychiatric:        Mood and Affect: Mood normal.        Behavior: Behavior normal.        Thought Content: Thought content normal.        Judgment: Judgment normal.      No results found for any visits on 10/19/22.  Last CBC Lab Results  Component Value Date   WBC 5.4 10/17/2021   HGB 13.1 10/17/2021   HCT 38.8 10/17/2021   MCV 97.3 10/17/2021   RDW 13.4 10/17/2021   PLT 282.0 10/17/2021   Last metabolic panel Lab Results  Component Value Date   GLUCOSE 91 10/17/2021   NA 138 10/17/2021   K 4.5 10/17/2021   CL 102 10/17/2021   CO2 27 10/17/2021   BUN 21 10/17/2021   CREATININE 0.95 10/17/2021   CALCIUM 9.5 10/17/2021   PROT 6.6 10/17/2021   ALBUMIN 4.5 10/17/2021   BILITOT 0.6 10/17/2021   ALKPHOS 55 10/17/2021   AST 21 10/17/2021   ALT 15 10/17/2021   Last lipids Lab Results  Component Value Date   CHOL 232 (H)  10/17/2021   HDL 89.50 10/17/2021   LDLCALC 131 (H) 10/17/2021   TRIG 57.0 10/17/2021   CHOLHDL 3 10/17/2021   Last hemoglobin A1c No results found for: "HGBA1C" Last thyroid functions Lab Results  Component Value Date   TSH 3.81 10/17/2019   Last vitamin D Lab Results  Component Value Date   VD25OH 73.80 10/17/2021   Last vitamin B12 and Folate No results found for: "VITAMINB12", "FOLATE"    The 10-year ASCVD risk score (Arnett DK, et al., 2019) is: 9.3%    Assessment & Plan:   Problem List Items Addressed This Visit       Unprioritized   Vitamin D deficiency   Relevant Orders   VITAMIN D 25 Hydroxy (Vit-D Deficiency, Fractures)   Hyperlipidemia - Primary    Encourage heart healthy diet such as MIND or DASH diet, increase exercise, avoid trans fats, simple carbohydrates and processed foods, consider a krill or fish or flaxseed oil cap daily.        Relevant Orders   Comprehensive metabolic panel   Lipid panel    Return in about 6 months (around 04/21/2023), or if symptoms worsen or fail  to improve, for hyperlipidemia, .    Donato Schultz, DO

## 2022-10-27 ENCOUNTER — Other Ambulatory Visit (INDEPENDENT_AMBULATORY_CARE_PROVIDER_SITE_OTHER): Payer: Medicare Other

## 2022-10-27 DIAGNOSIS — E559 Vitamin D deficiency, unspecified: Secondary | ICD-10-CM | POA: Diagnosis not present

## 2022-10-27 DIAGNOSIS — E785 Hyperlipidemia, unspecified: Secondary | ICD-10-CM | POA: Diagnosis not present

## 2022-10-27 LAB — COMPREHENSIVE METABOLIC PANEL
ALT: 14 U/L (ref 0–35)
AST: 20 U/L (ref 0–37)
Albumin: 4.2 g/dL (ref 3.5–5.2)
Alkaline Phosphatase: 57 U/L (ref 39–117)
BUN: 19 mg/dL (ref 6–23)
CO2: 25 mEq/L (ref 19–32)
Calcium: 9.4 mg/dL (ref 8.4–10.5)
Chloride: 105 mEq/L (ref 96–112)
Creatinine, Ser: 0.94 mg/dL (ref 0.40–1.20)
GFR: 62.82 mL/min (ref >60.00)
Glucose, Bld: 92 mg/dL (ref 70–99)
Potassium: 4.4 mEq/L (ref 3.5–5.1)
Sodium: 139 mEq/L (ref 135–145)
Total Bilirubin: 0.5 mg/dL (ref 0.2–1.2)
Total Protein: 6.6 g/dL (ref 6.0–8.3)

## 2022-10-27 LAB — LIPID PANEL
Cholesterol: 221 mg/dL — ABNORMAL HIGH (ref 0–200)
HDL: 81.9 mg/dL (ref >39.00)
LDL Cholesterol: 124 mg/dL — ABNORMAL HIGH (ref 0–99)
NonHDL: 138.75
Total CHOL/HDL Ratio: 3
Triglycerides: 73 mg/dL (ref 0.0–149.0)
VLDL: 14.6 mg/dL (ref 0.0–40.0)

## 2022-10-27 LAB — VITAMIN D 25 HYDROXY (VIT D DEFICIENCY, FRACTURES): VITD: 75.54 ng/mL (ref 30.00–100.00)

## 2022-12-28 ENCOUNTER — Telehealth: Payer: Self-pay | Admitting: Family Medicine

## 2022-12-28 NOTE — Telephone Encounter (Signed)
Copied from CRM [id number]. Topic: Medicare AWV >> Dec 28, 2022  2:36 PM Payton Doughty wrote: Reason for CRM: LM 12/28/2022 to schedule AWV   Verlee Rossetti; Care Guide Ambulatory Clinical Support [place] [facility] Direct Dial: [phone]

## 2023-02-17 ENCOUNTER — Telehealth: Payer: Self-pay | Admitting: Family Medicine

## 2023-02-17 MED ORDER — ESTRADIOL 0.1 MG/GM VA CREA: 1.0000 | TOPICAL_CREAM | VAGINAL | 2 refills | Status: DC

## 2023-02-17 NOTE — Telephone Encounter (Signed)
Prescription Request  02/17/2023  Is this a "Controlled Substance" medicine? No  LOV: 10/19/2022  What is the name of the medication or equipment?   estradiol (ESTRACE) 0.1 MG/GM vaginal cream [[id number]]  Have you contacted your pharmacy to request a refill? Yes   Which pharmacy would you like this sent to?   [id number] - [place], [place] - [address] AT [facility] [address] [place] [address] Phone: [phone] Fax: [phone]  Patient notified that their request is being sent to the clinical staff for review and that they should receive a response within 2 business days.   Please advise at Mobile [phone] (mobile)

## 2023-02-17 NOTE — Telephone Encounter (Signed)
Refill sent.

## 2023-02-17 NOTE — Addendum Note (Signed)
Addended by: Roxanne Gates on: 02/17/2023 04:33 PM   Modules accepted: Orders

## 2023-04-21 ENCOUNTER — Other Ambulatory Visit: Payer: Self-pay | Admitting: Family Medicine

## 2023-04-21 DIAGNOSIS — Z1231 Encounter for screening mammogram for malignant neoplasm of breast: Secondary | ICD-10-CM

## 2023-06-14 ENCOUNTER — Ambulatory Visit: Payer: Medicare Other

## 2023-07-15 ENCOUNTER — Ambulatory Visit
Admission: RE | Admit: 2023-07-15 | Discharge: 2023-07-15 | Disposition: A | Discharge status: Home / Self Care | Payer: Medicare Other | Source: Ambulatory Visit | Attending: Family Medicine | Admitting: Family Medicine

## 2023-07-15 DIAGNOSIS — Z1231 Encounter for screening mammogram for malignant neoplasm of breast: Secondary | ICD-10-CM

## 2023-08-12 ENCOUNTER — Other Ambulatory Visit: Payer: Self-pay | Admitting: Family Medicine

## 2023-08-12 NOTE — Telephone Encounter (Signed)
 Requesting: Ambien 5mg   Contract: None RUE:AVWU Last Visit: 10/19/22 Next Visit: 10/21/23 Last Refill: 09/24/21 #30 and 0RF   Please Advise

## 2023-10-19 IMAGING — CT CT CARDIAC CORONARY ARTERY CALCIUM SCORE
3 series · 14 of 20 positions shown, 15 images · non-contrast
Comparison: None Available.
COMPARISON: None Available.

Addendum:
EXAM:
OVER-READ INTERPRETATION  CT CHEST

The following report is a limited chest CT over-read performed by
11/17/2021. The coronary calcium score interpretation by the
cardiologist is attached.
TECHNIQUE: A gated, non-contrast computed tomography scan of the heart was
performed using 3mm slice thickness. Axial images were analyzed on a
dedicated workstation. Calcium scoring of the coronary arteries was
performed using the Agatston method.

[Series 3: ax ca scr 70% (id) · axial · 0.39mm/px · z∈[+1147,+1243]mm · 6 of 68 slices shown]
[im 10/68  vessel]
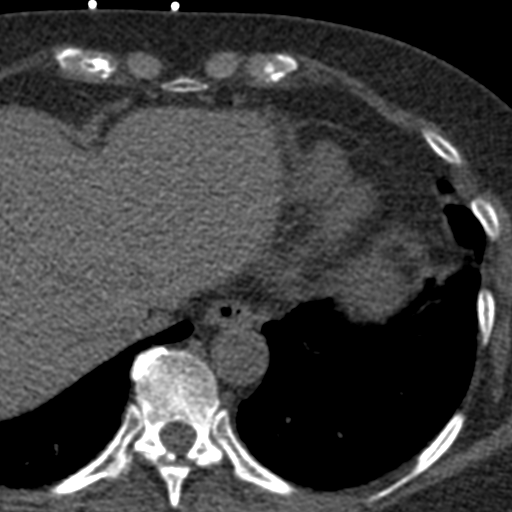
[im 20/68  vessel]
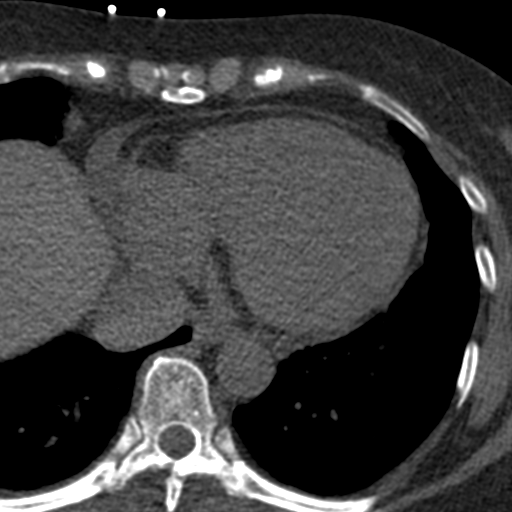
[im 29/68  vessel]
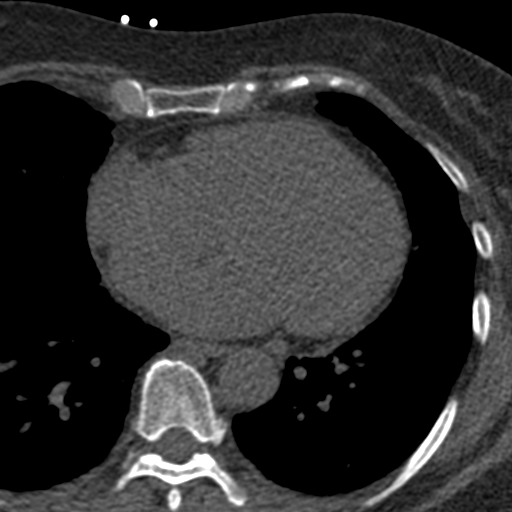
[im 39/68  vessel]
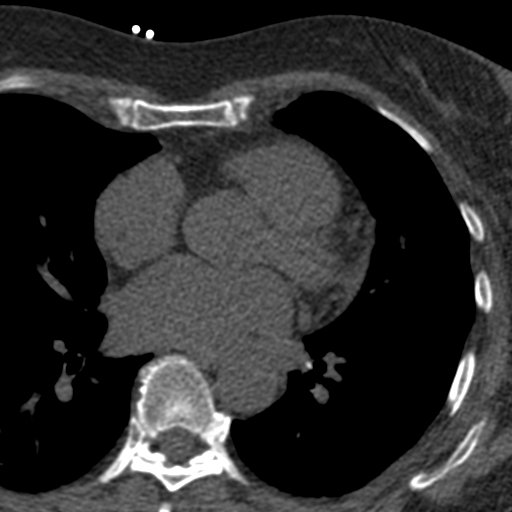
[im 48/68  vessel]
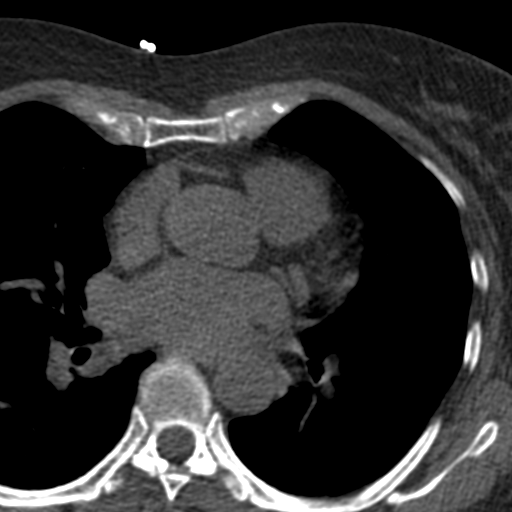
[im 58/68  vessel]
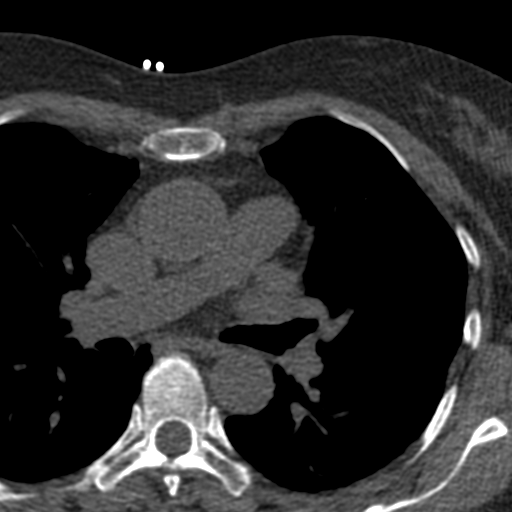

[Series 4: ax st · axial · 0.71mm/px · z∈[+1154,+1236]mm · 4 of 45 slices shown, 5 images]
[im 9/45  vessel]
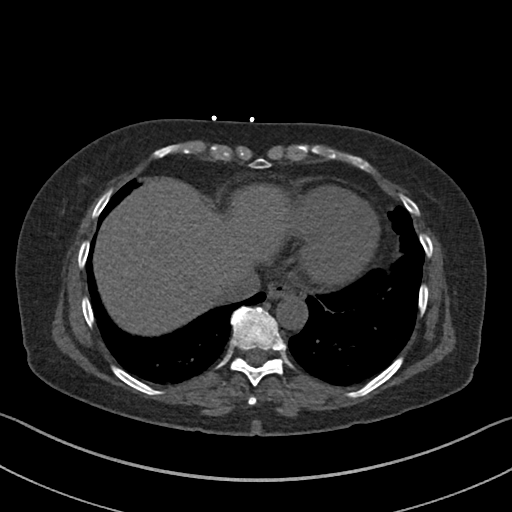
[im 9/45  lung]
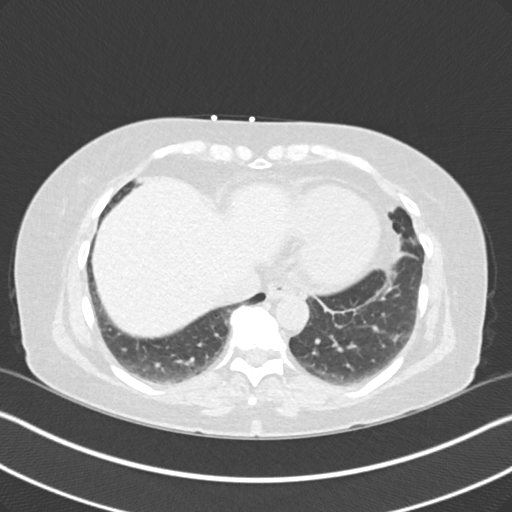
[im 18/45  vessel]
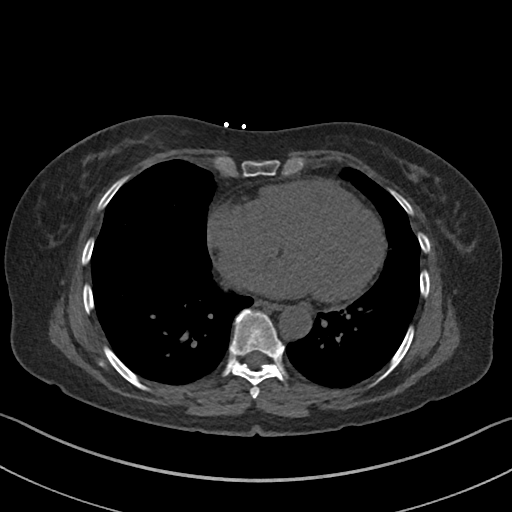
[im 27/45  vessel]
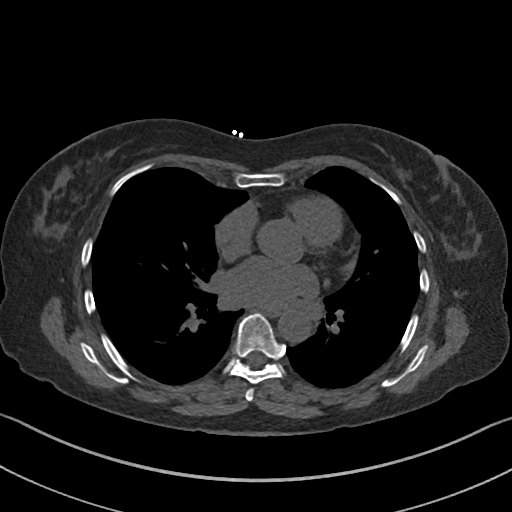
[im 36/45  vessel]
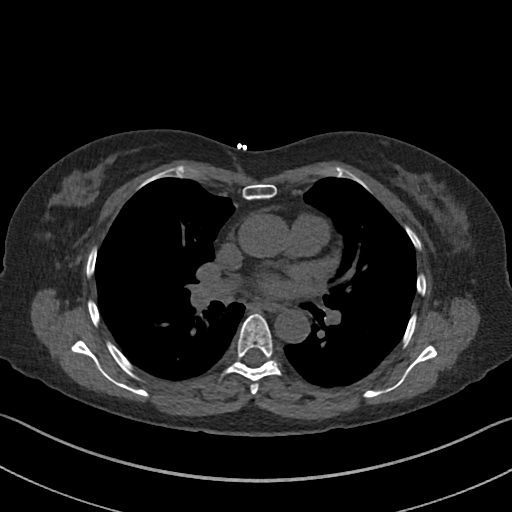

[Series 5: ax lung · axial · 0.71mm/px · z∈[+1154,+1236]mm · 4 of 45 slices shown]
[im 9/45  lung]
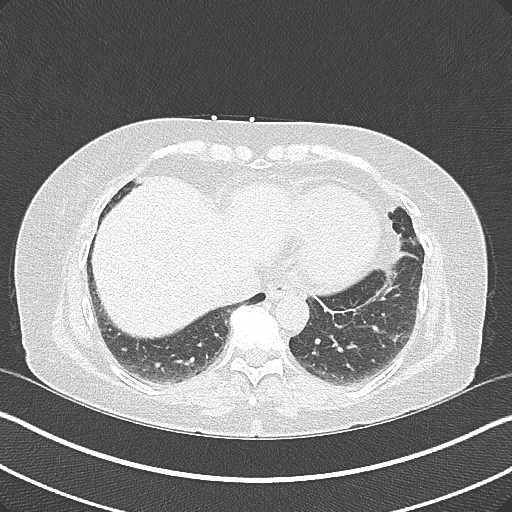
[im 18/45  lung]
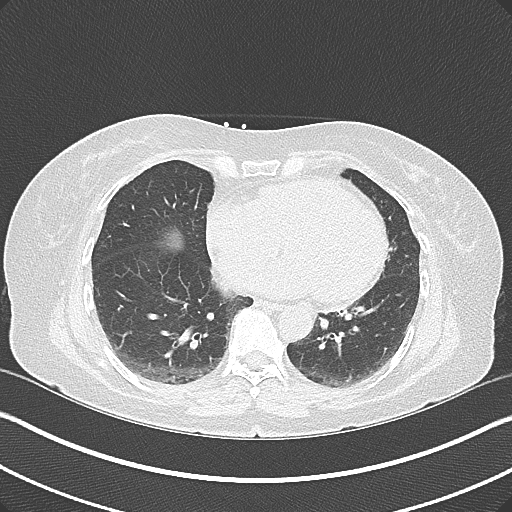
[im 27/45  lung]
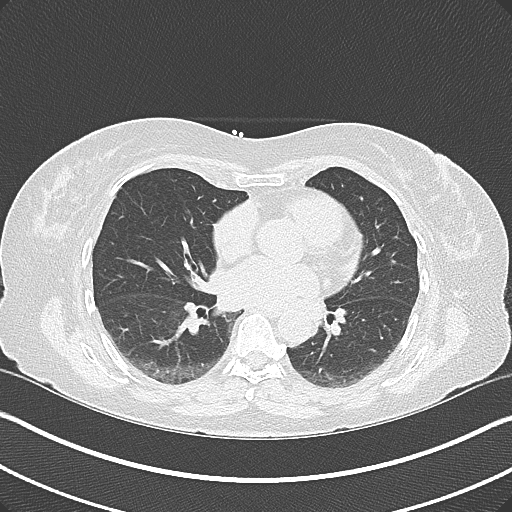
[im 36/45  lung]
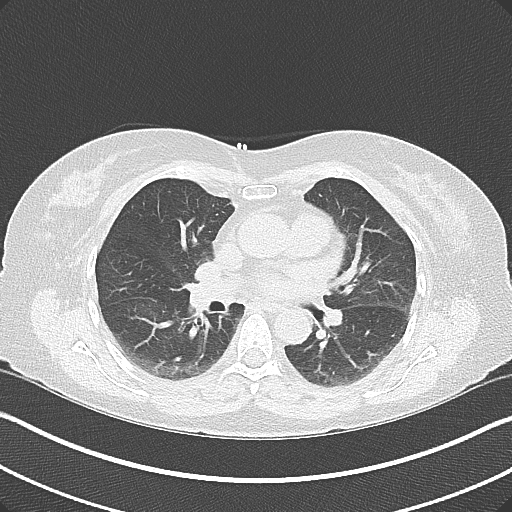

[14 of 20 positions shown; findings below may reference images not displayed]

FINDINGS: 2 mm left lower lobe pulmonary nodule (axial image 39 of series 5).
Within the visualized portions of the thorax there are no other
larger more suspicious appearing pulmonary nodules or masses, there
is no acute consolidative airspace disease, no pleural effusions, no
pneumothorax and no lymphadenopathy. Visualized portions of the
upper abdomen are unremarkable. There are no aggressive appearing
lytic or blastic lesions noted in the visualized portions of the
skeleton.
IMPRESSION: 1. 2 mm left lower lobe pulmonary nodule, nonspecific, but
statistically likely benign. No follow-up needed if patient is
low-risk.This recommendation follows the consensus statement:
Guidelines for Management of Incidental Pulmonary Nodules Detected

ADDENDUM:
Cardiovascular Disease Risk stratification

EXAM:
Coronary Calcium Score
FINDINGS: Coronary arteries: Normal origins.

Coronary Calcium Score:

Left main: 0

Left anterior descending artery: 0

Left circumflex artery: 0

Right coronary artery: 0

Total: 0

Percentile: 0

Pericardium: Normal.

Ascending Aorta: Normal caliber.

Non-cardiac: See separate report from [REDACTED].
IMPRESSION: Coronary calcium score of 0. This was 0 percentile for age-, race-,
and sex-matched controls.



If CAC=0, it is reasonable to withhold statin therapy and reassess
in 5 to 10 years, as long as higher risk conditions are absent
(diabetes mellitus, family history of premature CHD in first degree
relatives (males <55 years; females <65 years), cigarette smoking,
or LDL >=190 mg/dL).

If CAC is 1 to 99, it is reasonable to initiate statin therapy for
patients >=55 years of age.

If CAC is >=100 or >=75th percentile, it is reasonable to initiate
statin therapy at any age.

Cardiology referral should be considered for patients with CAC
scores >=400 or >=75th percentile.

*[id number] AHA/ACC/AACVPR/AAPA/ABC/ALCINDOR/KAKOU/BAMBUCAFE/Von/AVICHAL/TIGER/LUCENILDO
Guideline on the Management of Blood Cholesterol: A Report of the
American College of Cardiology/American Heart Association Task Force
on Clinical Practice Guidelines. J Am Coll Cardiol.
3241;73(24):6755-6307.

Kiri Jim, DO [REDACTED]

The noncardiac portion of this study will be interpreted in separate
report by the radiologist.

*** End of Addendum ***
EXAM:
OVER-READ INTERPRETATION  CT CHEST

The following report is a limited chest CT over-read performed by
11/17/2021. The coronary calcium score interpretation by the
cardiologist is attached.
FINDINGS: 2 mm left lower lobe pulmonary nodule (axial image 39 of series 5).
Within the visualized portions of the thorax there are no other
larger more suspicious appearing pulmonary nodules or masses, there
is no acute consolidative airspace disease, no pleural effusions, no
pneumothorax and no lymphadenopathy. Visualized portions of the
upper abdomen are unremarkable. There are no aggressive appearing
lytic or blastic lesions noted in the visualized portions of the
skeleton.
IMPRESSION: 1. 2 mm left lower lobe pulmonary nodule, nonspecific, but
statistically likely benign. No follow-up needed if patient is
low-risk.This recommendation follows the consensus statement:
Guidelines for Management of Incidental Pulmonary Nodules Detected

## 2023-10-21 ENCOUNTER — Ambulatory Visit (INDEPENDENT_AMBULATORY_CARE_PROVIDER_SITE_OTHER): Payer: Medicare Other | Admitting: Family Medicine

## 2023-10-21 ENCOUNTER — Encounter: Payer: Self-pay | Admitting: Family Medicine

## 2023-10-21 VITALS — BP 120/80 | HR 97 | Temp 97.8°F | Resp 16 | Ht 66.0 in | Wt 144.8 lb

## 2023-10-21 DIAGNOSIS — E785 Hyperlipidemia, unspecified: Secondary | ICD-10-CM | POA: Diagnosis not present

## 2023-10-21 DIAGNOSIS — E559 Vitamin D deficiency, unspecified: Secondary | ICD-10-CM

## 2023-10-21 LAB — COMPREHENSIVE METABOLIC PANEL WITH GFR
ALT: 16 U/L (ref 0–35)
AST: 21 U/L (ref 0–37)
Albumin: 4.6 g/dL (ref 3.5–5.2)
Alkaline Phosphatase: 72 U/L (ref 39–117)
BUN: 21 mg/dL (ref 6–23)
CO2: 27 meq/L (ref 19–32)
Calcium: 9.7 mg/dL (ref 8.4–10.5)
Chloride: 103 meq/L (ref 96–112)
Creatinine, Ser: 0.97 mg/dL (ref 0.40–1.20)
GFR: 60.08 mL/min (ref >60.00)
Glucose, Bld: 97 mg/dL (ref 70–99)
Potassium: 4.7 meq/L (ref 3.5–5.1)
Sodium: 140 meq/L (ref 135–145)
Total Bilirubin: 0.5 mg/dL (ref 0.2–1.2)
Total Protein: 6.9 g/dL (ref 6.0–8.3)

## 2023-10-21 LAB — CBC WITH DIFFERENTIAL/PLATELET
Basophils Absolute: 0 10*3/uL (ref 0.0–0.1)
Basophils Relative: 0.5 % (ref 0.0–3.0)
Eosinophils Absolute: 0.2 10*3/uL (ref 0.0–0.7)
Eosinophils Relative: 2.4 % (ref 0.0–5.0)
HCT: 38.9 % (ref 36.0–46.0)
Hemoglobin: 13.3 g/dL (ref 12.0–15.0)
Lymphocytes Relative: 24 % (ref 12.0–46.0)
Lymphs Abs: 1.7 10*3/uL (ref 0.7–4.0)
MCHC: 34.1 g/dL (ref 30.0–36.0)
MCV: 94.5 fl (ref 78.0–100.0)
Monocytes Absolute: 0.5 10*3/uL (ref 0.1–1.0)
Monocytes Relative: 7.6 % (ref 3.0–12.0)
Neutro Abs: 4.6 10*3/uL (ref 1.4–7.7)
Neutrophils Relative %: 65.5 % (ref 43.0–77.0)
Platelets: 326 10*3/uL (ref 150.0–400.0)
RBC: 4.12 Mil/uL (ref 3.87–5.11)
RDW: 13.7 % (ref 11.5–15.5)
WBC: 7 10*3/uL (ref 4.0–10.5)

## 2023-10-21 LAB — TSH: TSH: 6.24 u[IU]/mL — ABNORMAL HIGH (ref 0.35–5.50)

## 2023-10-21 LAB — VITAMIN D 25 HYDROXY (VIT D DEFICIENCY, FRACTURES): VITD: 78.3 ng/mL (ref 30.00–100.00)

## 2023-10-21 LAB — LIPID PANEL
Cholesterol: 256 mg/dL — ABNORMAL HIGH (ref 0–200)
HDL: 91.3 mg/dL (ref >39.00)
LDL Cholesterol: 147 mg/dL — ABNORMAL HIGH (ref 0–99)
NonHDL: 164.72
Total CHOL/HDL Ratio: 3
Triglycerides: 89 mg/dL (ref 0.0–149.0)
VLDL: 17.8 mg/dL (ref 0.0–40.0)

## 2023-10-21 NOTE — Assessment & Plan Note (Signed)
 Encourage heart healthy diet such as MIND or DASH diet, increase exercise, avoid trans fats, simple carbohydrates and processed foods, consider a krill or fish or flaxseed oil cap daily.

## 2023-10-21 NOTE — Progress Notes (Signed)
 Established Patient Office Visit  Subjective   Patient ID: Anita Galloway, female    DOB: [birth year]  Age: 68 y.o. MRN: [id number]  Chief Complaint  Patient presents with   Hyperlipidemia    HPI Discussed the use of AI scribe software for clinical note transcription with the patient, who gave verbal consent to proceed.  History of Present Illness Anita Galloway "Anita Galloway" is a 68 year old female who presents for a routine follow-up visit.  Her blood pressure was recently adjusted and is now at 98/68 mmHg, which is lower than her usual readings. She has not experienced any dizziness or lightheadedness associated with this change.  She has bunions, with one causing pain. She is exploring non-surgical options such as wearing larger shoes and using devices seen online.  She experiences joint pain in her knees and hips, which has improved following a consultation with an orthopedic surgeon who recommended physical therapy and stretches. She remains active with water aerobics, hiking, tennis, and golf.  She takes a new vitamin pill twice a day for vision as recommended by her eye doctor and uses readers but does not have a prescription for regular glasses.  Her family history is significant for her older sister showing signs of dementia and her mother having had Alzheimer's disease. Her middle sister has osteopenia.   Patient Active Problem List   Diagnosis Date Noted   Vitamin D  deficiency 10/17/2021   Hyperlipidemia 10/29/2020   Osteopenia 09/27/2013   FROZEN RIGHT SHOULDER 09/03/2009   THROMBOPHLEBITIS NOS 01/18/2007   Past Medical History:  Diagnosis Date   Phlebitis    after C-section---left leg   Past Surgical History:  Procedure Laterality Date   CESAREAN SECTION     x's 2   COLONOSCOPY  03/07/2008   w/Dr.Brodie   DILATION AND CURETTAGE OF UTERUS  11/2013   MASS EXCISION  02/23/2012   Procedure: MINOR EXCISION OF MASS;  Surgeon: Amelie Baize.,  MD;  Location: [facility];  Service: Orthopedics;  Laterality: Left;  excision cyst left long finger   MOHS SURGERY Left    08/2016   r 5th metacarpal Right 02/2019   gary kuzma    Social History   Tobacco Use   Smoking status: Former   Smokeless tobacco: Never   Tobacco comments:    smoked in college  Vaping Use   Vaping status: Never Used  Substance Use Topics   Alcohol use: Yes    Alcohol/week: 7.0 - 14.0 standard drinks of alcohol    Types: 7 - 14 Glasses of wine per week    Comment: 1-2 glasses wine per night per pt   Drug use: No   Social History   Socioeconomic History   Marital status: Married    Spouse name: Not on file   Number of children: Not on file   Years of education: Not on file   Highest education level: Bachelor's degree (e.g., BA, AB, BS)  Occupational History   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  Tobacco Use   Smoking status: Former   Smokeless tobacco: Never   Tobacco comments:    smoked in college  Vaping Use   Vaping status: Never Used  Substance and Sexual Activity   Alcohol use: Yes    Alcohol/week: 7.0 - 14.0 standard drinks of alcohol    Types: 7 - 14 Glasses of wine per week    Comment: 1-2 glasses wine per night per pt   Drug  use: No   Sexual activity: Yes    Partners: Male  Other Topics Concern   Not on file  Social History Narrative   Exercising-- treadmill, walk, elliptical, water aerobics, tennis    Social Drivers of Health   Financial Resource Strain: Low Risk  (10/20/2023)   Overall Financial Resource Strain (CARDIA)    Difficulty of Paying Living Expenses: Not hard at all  Food Insecurity: No Food Insecurity (10/20/2023)   Hunger Vital Sign    Worried About Running Out of Food in the Last Year: Never true    Ran Out of Food in the Last Year: Never true  Transportation Needs: No Transportation Needs (10/20/2023)   PRAPARE - Administrator, Civil Service (Medical): No    Lack of  Transportation (Non-Medical): No  Physical Activity: Sufficiently Active (10/20/2023)   Exercise Vital Sign    Days of Exercise per Week: 3 days    Minutes of Exercise per Session: 50 min  Stress: No Stress Concern Present (10/20/2023)   [employer] of Occupational Health - Occupational Stress Questionnaire    Feeling of Stress : Not at all  Social Connections: Socially Integrated (10/20/2023)   Social Connection and Isolation Panel [NHANES]    Frequency of Communication with Friends and Family: Twice a week    Frequency of Social Gatherings with Friends and Family: Three times a week    Attends Religious Services: More than 4 times per year    Active Member of Clubs or Organizations: Yes    Attends Banker Meetings: More than 4 times per year    Marital Status: Married  Catering manager Violence: Not At Risk (12/29/2021)   Humiliation, Afraid, Rape, and Kick questionnaire    Fear of Current or Ex-Partner: No    Emotionally Abused: No    Physically Abused: No    Sexually Abused: No   Family Status  Relation Name Status   Mother  Deceased   Father  Deceased at age 96       CHF   Sister  Alive   Sister  Alive   PGM  (Not Specified)   Other  (Not Specified)   Other  (Not Specified)   Neg Hx  (Not Specified)  No partnership data on file      Review of Systems  Constitutional:  Negative for fever and malaise/fatigue.  HENT:  Negative for congestion.   Eyes:  Negative for blurred vision.  Respiratory:  Negative for cough and shortness of breath.   Cardiovascular:  Negative for chest pain, palpitations and leg swelling.  Gastrointestinal:  Negative for abdominal pain, blood in stool, nausea and vomiting.  Genitourinary:  Negative for dysuria and frequency.  Musculoskeletal:  Negative for back pain and falls.  Skin:  Negative for rash.  Neurological:  Negative for dizziness, loss of consciousness and headaches.  Endo/Heme/Allergies:  Negative for environmental  allergies.  Psychiatric/Behavioral:  Negative for depression. The patient is not nervous/anxious.       Objective:     BP 120/80 (BP Location: Left Arm, Patient Position: Sitting, Cuff Size: Normal)   Pulse 97   Temp 97.8 F (36.6 C) (Oral)   Resp 16   Ht 5\' 6"  (1.676 m)   Wt 144 lb 12.8 oz (65.7 kg)   SpO2 98%   BMI 23.37 kg/m  BP Readings from Last 3 Encounters:  10/21/23 120/80  10/19/22 118/80  10/17/21 120/82   Wt Readings from Last 3 Encounters:  10/21/23 144 lb 12.8 oz (65.7 kg)  10/19/22 145 lb 12.8 oz (66.1 kg)  12/29/21 144 lb (65.3 kg)   SpO2 Readings from Last 3 Encounters:  10/21/23 98%  10/19/22 99%  10/17/21 97%      Physical Exam Vitals and nursing note reviewed.  Constitutional:      General: She is not in acute distress.    Appearance: Normal appearance. She is well-developed.  HENT:     Head: Normocephalic and atraumatic.  Eyes:     General: No scleral icterus.       Right eye: No discharge.        Left eye: No discharge.  Cardiovascular:     Rate and Rhythm: Normal rate and regular rhythm.     Heart sounds: No murmur heard. Pulmonary:     Effort: Pulmonary effort is normal. No respiratory distress.     Breath sounds: Normal breath sounds.  Musculoskeletal:        General: Normal range of motion.     Cervical back: Normal range of motion and neck supple.     Right lower leg: No edema.     Left lower leg: No edema.  Skin:    General: Skin is warm and dry.  Neurological:     Mental Status: She is alert and oriented to person, place, and time.  Psychiatric:        Mood and Affect: Mood normal.        Behavior: Behavior normal.        Thought Content: Thought content normal.        Judgment: Judgment normal.      No results found for any visits on 10/21/23.  Last CBC Lab Results  Component Value Date   WBC 5.4 10/17/2021   HGB 13.1 10/17/2021   HCT 38.8 10/17/2021   MCV 97.3 10/17/2021   RDW 13.4 10/17/2021   PLT 282.0  10/17/2021   Last metabolic panel Lab Results  Component Value Date   GLUCOSE 92 10/27/2022   NA 139 10/27/2022   K 4.4 10/27/2022   CL 105 10/27/2022   CO2 25 10/27/2022   BUN 19 10/27/2022   CREATININE 0.94 10/27/2022   GFR 62.82 10/27/2022   CALCIUM 9.4 10/27/2022   PROT 6.6 10/27/2022   ALBUMIN 4.2 10/27/2022   BILITOT 0.5 10/27/2022   ALKPHOS 57 10/27/2022   AST 20 10/27/2022   ALT 14 10/27/2022   Last lipids Lab Results  Component Value Date   CHOL 221 (H) 10/27/2022   HDL 81.90 10/27/2022   LDLCALC 124 (H) 10/27/2022   TRIG 73.0 10/27/2022   CHOLHDL 3 10/27/2022   Last hemoglobin A1c No results found for: "HGBA1C" Last thyroid  functions Lab Results  Component Value Date   TSH 3.81 10/17/2019   Last vitamin D  Lab Results  Component Value Date   VD25OH 75.54 10/27/2022   Last vitamin B12 and Folate No results found for: "VITAMINB12", "FOLATE"    The 10-year ASCVD risk score (Arnett DK, et al., 2019) is: 6.3%    Assessment & Plan:   Problem List Items Addressed This Visit       Unprioritized   Vitamin D  deficiency   Relevant Orders   VITAMIN D  25 Hydroxy (Vit-D Deficiency, Fractures)   Hyperlipidemia - Primary   Encourage heart healthy diet such as MIND or DASH diet, increase exercise, avoid trans fats, simple carbohydrates and processed foods, consider a krill or fish or flaxseed oil cap daily.  Relevant Orders   CBC with Differential/Platelet   Comprehensive metabolic panel with GFR   Lipid panel   TSH   Lipoprotein A (LPA)  Assessment and Plan Assessment & Plan Bunions   Bunions cause pain, particularly in one foot. She is hesitant about surgery due to recovery time and pain. The chiropractor advised against surgery unless symptoms worsen, suggesting larger shoes as an alternative. She is aware of non-surgical options like orthotic devices and massages but doubts their effectiveness. Consider larger shoes to alleviate discomfort and  avoid surgery unless symptoms worsen.  Osteoporosis   Osteoporosis is noted in her family history, with her middle sister having osteopenia. She understands the condition and its implications, especially given the family history of Alzheimer's and dementia.  General Health Maintenance   She is current with mammogram, bone density, and tetanus vaccinations. Colonoscopy is not due until 2030. The RSV vaccine is recommended for those 65 and over but is not emphasized unless there are underlying conditions like asthma or COPD. She has received the shingles vaccine. Continue routine screenings and vaccinations as scheduled. Consider the RSV vaccine if underlying conditions develop.    No follow-ups on file.    Demetric Dunnaway R Lowne Chase, DO

## 2023-10-25 ENCOUNTER — Ambulatory Visit: Payer: Self-pay | Admitting: Family Medicine

## 2023-10-25 ENCOUNTER — Other Ambulatory Visit: Payer: Self-pay | Admitting: Family Medicine

## 2023-10-25 DIAGNOSIS — E785 Hyperlipidemia, unspecified: Secondary | ICD-10-CM

## 2023-10-26 LAB — LIPOPROTEIN A (LPA): Lipoprotein (a): 73 nmol/L (ref <75)

## 2024-05-16 ENCOUNTER — Other Ambulatory Visit: Payer: Self-pay | Admitting: Family Medicine

## 2024-05-25 ENCOUNTER — Telehealth: Payer: Self-pay | Admitting: Family Medicine

## 2024-05-25 NOTE — Telephone Encounter (Signed)
 Copied from CRM #[id number]. Topic: Medicare AWV >> May 25, 2024 11:53 AM Nathanel DEL wrote: Called 05/25/2024 to sched AWV - NO VOICEMAIL  Nathanel Paschal; Care Guide Ambulatory Clinical Support [facility] [facility] Direct Dial: [phone]

## 2024-06-02 ENCOUNTER — Other Ambulatory Visit: Payer: Self-pay | Admitting: Family Medicine

## 2024-06-02 DIAGNOSIS — Z1231 Encounter for screening mammogram for malignant neoplasm of breast: Secondary | ICD-10-CM

## 2024-07-17 ENCOUNTER — Ambulatory Visit

## 2024-10-24 ENCOUNTER — Encounter: Admitting: Family Medicine
